# Patient Record
Sex: Male | Born: 1960 | Race: White | Hispanic: No | Marital: Single | Smoking: Former smoker
Health system: Southern US, Community
[De-identification: ages and names within clinical notes are randomized; demographics above are authoritative.]

## PROBLEM LIST (undated history)

## (undated) DIAGNOSIS — I10 Essential (primary) hypertension: Secondary | ICD-10-CM

## (undated) DIAGNOSIS — H547 Unspecified visual loss: Secondary | ICD-10-CM

## (undated) DIAGNOSIS — E785 Hyperlipidemia, unspecified: Secondary | ICD-10-CM

## (undated) HISTORY — PX: KNEE ARTHROSCOPY: SUR90

---

## 2006-05-18 HISTORY — PX: ANEURYSM COILING: SHX5349

## 2021-07-17 ENCOUNTER — Emergency Department (HOSPITAL_COMMUNITY): Payer: Managed Care, Other (non HMO)

## 2021-07-17 ENCOUNTER — Inpatient Hospital Stay (HOSPITAL_COMMUNITY)
Admission: EM | Admit: 2021-07-17 | Discharge: 2021-07-23 | DRG: 286 | Disposition: A | Discharge status: Home / Self Care | Payer: Managed Care, Other (non HMO) | Attending: Internal Medicine | Admitting: Internal Medicine

## 2021-07-17 ENCOUNTER — Encounter (HOSPITAL_COMMUNITY): Payer: Self-pay | Admitting: Emergency Medicine

## 2021-07-17 ENCOUNTER — Observation Stay (HOSPITAL_BASED_OUTPATIENT_CLINIC_OR_DEPARTMENT_OTHER): Payer: Managed Care, Other (non HMO)

## 2021-07-17 DIAGNOSIS — I2510 Atherosclerotic heart disease of native coronary artery without angina pectoris: Secondary | ICD-10-CM | POA: Diagnosis present

## 2021-07-17 DIAGNOSIS — H547 Unspecified visual loss: Secondary | ICD-10-CM | POA: Diagnosis present

## 2021-07-17 DIAGNOSIS — I428 Other cardiomyopathies: Secondary | ICD-10-CM | POA: Diagnosis present

## 2021-07-17 DIAGNOSIS — F1020 Alcohol dependence, uncomplicated: Secondary | ICD-10-CM | POA: Diagnosis present

## 2021-07-17 DIAGNOSIS — Z66 Do not resuscitate: Secondary | ICD-10-CM | POA: Diagnosis present

## 2021-07-17 DIAGNOSIS — I10 Essential (primary) hypertension: Secondary | ICD-10-CM | POA: Diagnosis present

## 2021-07-17 DIAGNOSIS — I429 Cardiomyopathy, unspecified: Secondary | ICD-10-CM | POA: Diagnosis not present

## 2021-07-17 DIAGNOSIS — I272 Pulmonary hypertension, unspecified: Secondary | ICD-10-CM | POA: Diagnosis present

## 2021-07-17 DIAGNOSIS — K761 Chronic passive congestion of liver: Secondary | ICD-10-CM | POA: Diagnosis present

## 2021-07-17 DIAGNOSIS — Z8249 Family history of ischemic heart disease and other diseases of the circulatory system: Secondary | ICD-10-CM | POA: Diagnosis not present

## 2021-07-17 DIAGNOSIS — Z6841 Body Mass Index (BMI) 40.0 and over, adult: Secondary | ICD-10-CM

## 2021-07-17 DIAGNOSIS — E785 Hyperlipidemia, unspecified: Secondary | ICD-10-CM | POA: Diagnosis present

## 2021-07-17 DIAGNOSIS — R570 Cardiogenic shock: Secondary | ICD-10-CM | POA: Diagnosis not present

## 2021-07-17 DIAGNOSIS — Z20822 Contact with and (suspected) exposure to covid-19: Secondary | ICD-10-CM | POA: Diagnosis present

## 2021-07-17 DIAGNOSIS — N179 Acute kidney failure, unspecified: Secondary | ICD-10-CM | POA: Diagnosis not present

## 2021-07-17 DIAGNOSIS — R7989 Other specified abnormal findings of blood chemistry: Secondary | ICD-10-CM | POA: Diagnosis present

## 2021-07-17 DIAGNOSIS — E871 Hypo-osmolality and hyponatremia: Secondary | ICD-10-CM | POA: Diagnosis present

## 2021-07-17 DIAGNOSIS — R000 Tachycardia, unspecified: Secondary | ICD-10-CM

## 2021-07-17 DIAGNOSIS — I471 Supraventricular tachycardia: Secondary | ICD-10-CM | POA: Diagnosis present

## 2021-07-17 DIAGNOSIS — F1010 Alcohol abuse, uncomplicated: Secondary | ICD-10-CM | POA: Diagnosis present

## 2021-07-17 DIAGNOSIS — E669 Obesity, unspecified: Secondary | ICD-10-CM | POA: Diagnosis present

## 2021-07-17 DIAGNOSIS — I4891 Unspecified atrial fibrillation: Secondary | ICD-10-CM | POA: Diagnosis present

## 2021-07-17 DIAGNOSIS — N170 Acute kidney failure with tubular necrosis: Secondary | ICD-10-CM | POA: Diagnosis present

## 2021-07-17 DIAGNOSIS — I483 Typical atrial flutter: Secondary | ICD-10-CM | POA: Diagnosis present

## 2021-07-17 DIAGNOSIS — I509 Heart failure, unspecified: Secondary | ICD-10-CM

## 2021-07-17 DIAGNOSIS — Z820 Family history of epilepsy and other diseases of the nervous system: Secondary | ICD-10-CM | POA: Diagnosis not present

## 2021-07-17 DIAGNOSIS — I5082 Biventricular heart failure: Secondary | ICD-10-CM | POA: Diagnosis present

## 2021-07-17 DIAGNOSIS — E875 Hyperkalemia: Secondary | ICD-10-CM | POA: Diagnosis present

## 2021-07-17 DIAGNOSIS — R062 Wheezing: Secondary | ICD-10-CM | POA: Insufficient documentation

## 2021-07-17 DIAGNOSIS — I110 Hypertensive heart disease with heart failure: Secondary | ICD-10-CM | POA: Diagnosis present

## 2021-07-17 DIAGNOSIS — R9431 Abnormal electrocardiogram [ECG] [EKG]: Secondary | ICD-10-CM | POA: Insufficient documentation

## 2021-07-17 DIAGNOSIS — I5021 Acute systolic (congestive) heart failure: Secondary | ICD-10-CM | POA: Diagnosis present

## 2021-07-17 DIAGNOSIS — Z87891 Personal history of nicotine dependence: Secondary | ICD-10-CM

## 2021-07-17 DIAGNOSIS — I4892 Unspecified atrial flutter: Secondary | ICD-10-CM | POA: Diagnosis present

## 2021-07-17 DIAGNOSIS — Z79899 Other long term (current) drug therapy: Secondary | ICD-10-CM

## 2021-07-17 HISTORY — DX: Hyperlipidemia, unspecified: E78.5

## 2021-07-17 HISTORY — DX: Essential (primary) hypertension: I10

## 2021-07-17 HISTORY — DX: Unspecified visual loss: H54.7

## 2021-07-17 LAB — CBC WITH DIFFERENTIAL/PLATELET
Abs Immature Granulocytes: 0.05 10*3/uL (ref 0.00–0.07)
Basophils Absolute: 0 10*3/uL (ref 0.0–0.1)
Basophils Relative: 1 %
Eosinophils Absolute: 0.1 10*3/uL (ref 0.0–0.5)
Eosinophils Relative: 2 %
HCT: 48.9 % (ref 39.0–52.0)
Hemoglobin: 15.8 g/dL (ref 13.0–17.0)
Immature Granulocytes: 1 %
Lymphocytes Relative: 21 %
Lymphs Abs: 1.8 10*3/uL (ref 0.7–4.0)
MCH: 30.4 pg (ref 26.0–34.0)
MCHC: 32.3 g/dL (ref 30.0–36.0)
MCV: 94 fL (ref 80.0–100.0)
Monocytes Absolute: 0.8 10*3/uL (ref 0.1–1.0)
Monocytes Relative: 9 %
Neutro Abs: 5.9 10*3/uL (ref 1.7–7.7)
Neutrophils Relative %: 66 %
Platelets: 335 10*3/uL (ref 150–400)
RBC: 5.2 MIL/uL (ref 4.22–5.81)
RDW: 14 % (ref 11.5–15.5)
WBC: 8.6 10*3/uL (ref 4.0–10.5)
nRBC: 0 % (ref 0.0–0.2)

## 2021-07-17 LAB — HEMOGLOBIN A1C
Hgb A1c MFr Bld: 5.6 % (ref 4.8–5.6)
Mean Plasma Glucose: 114.02 mg/dL

## 2021-07-17 LAB — PROTIME-INR
INR: 1.1 (ref 0.8–1.2)
Prothrombin Time: 14.6 seconds (ref 11.4–15.2)

## 2021-07-17 LAB — COMPREHENSIVE METABOLIC PANEL
ALT: 188 U/L — ABNORMAL HIGH (ref 0–44)
AST: 71 U/L — ABNORMAL HIGH (ref 15–41)
Albumin: 3.5 g/dL (ref 3.5–5.0)
Alkaline Phosphatase: 47 U/L (ref 38–126)
Anion gap: 10 (ref 5–15)
BUN: 28 mg/dL — ABNORMAL HIGH (ref 8–23)
CO2: 25 mmol/L (ref 22–32)
Calcium: 8.7 mg/dL — ABNORMAL LOW (ref 8.9–10.3)
Chloride: 98 mmol/L (ref 98–111)
Creatinine, Ser: 0.98 mg/dL (ref 0.61–1.24)
GFR, Estimated: >60 mL/min (ref >60)
Glucose, Bld: 118 mg/dL — ABNORMAL HIGH (ref 70–99)
Potassium: 4 mmol/L (ref 3.5–5.1)
Sodium: 133 mmol/L — ABNORMAL LOW (ref 135–145)
Total Bilirubin: 0.7 mg/dL (ref 0.3–1.2)
Total Protein: 6 g/dL — ABNORMAL LOW (ref 6.5–8.1)

## 2021-07-17 LAB — GLUCOSE, CAPILLARY: Glucose-Capillary: 173 mg/dL — ABNORMAL HIGH (ref 70–99)

## 2021-07-17 LAB — LIPID PANEL
Cholesterol: 152 mg/dL (ref 0–200)
HDL: 18 mg/dL — ABNORMAL LOW (ref >40)
LDL Cholesterol: 115 mg/dL — ABNORMAL HIGH (ref 0–99)
Total CHOL/HDL Ratio: 8.4 RATIO
Triglycerides: 96 mg/dL (ref <150)
VLDL: 19 mg/dL (ref 0–40)

## 2021-07-17 LAB — ECHOCARDIOGRAM COMPLETE
AR max vel: 2.71 cm2
AV Peak grad: 3.4 mmHg
Ao pk vel: 0.93 m/s
Area-P 1/2: 5.93 cm2
Calc EF: 24.2 %
MV M vel: 3.78 m/s
MV Peak grad: 57 mmHg
S' Lateral: 5.5 cm
Single Plane A2C EF: 25.9 %
Single Plane A4C EF: 23.8 %

## 2021-07-17 LAB — HIV ANTIBODY (ROUTINE TESTING W REFLEX): HIV Screen 4th Generation wRfx: NONREACTIVE

## 2021-07-17 LAB — TROPONIN I (HIGH SENSITIVITY): Troponin I (High Sensitivity): 74 ng/L — ABNORMAL HIGH (ref <18)

## 2021-07-17 LAB — TSH: TSH: 1.535 u[IU]/mL (ref 0.350–4.500)

## 2021-07-17 LAB — RESP PANEL BY RT-PCR (FLU A&B, COVID) ARPGX2
Influenza A by PCR: NEGATIVE
Influenza B by PCR: NEGATIVE
SARS Coronavirus 2 by RT PCR: NEGATIVE

## 2021-07-17 MED ORDER — FOLIC ACID 1 MG PO TABS
1.0000 mg | ORAL_TABLET | Freq: Every day | ORAL | Status: DC
  Administered 2021-07-17 – 2021-07-23 (×7): 1 mg via ORAL
  Filled 2021-07-17 (×7): qty 1

## 2021-07-17 MED ORDER — LORAZEPAM 2 MG/ML IJ SOLN: 1.0000 mg | INTRAMUSCULAR | Status: AC | PRN

## 2021-07-17 MED ORDER — ALBUTEROL SULFATE (2.5 MG/3ML) 0.083% IN NEBU: 2.5000 mg | INHALATION_SOLUTION | RESPIRATORY_TRACT | Status: DC | PRN

## 2021-07-17 MED ORDER — METOPROLOL TARTRATE 5 MG/5ML IV SOLN
5.0000 mg | Freq: Once | INTRAVENOUS | Status: AC
  Administered 2021-07-17: 5 mg via INTRAVENOUS
  Filled 2021-07-17: qty 5

## 2021-07-17 MED ORDER — ADULT MULTIVITAMIN W/MINERALS CH
1.0000 | ORAL_TABLET | Freq: Every day | ORAL | Status: DC
  Administered 2021-07-17 – 2021-07-23 (×7): 1 via ORAL
  Filled 2021-07-17 (×7): qty 1

## 2021-07-17 MED ORDER — PERFLUTREN LIPID MICROSPHERE
1.0000 mL | INTRAVENOUS | Status: AC | PRN
  Administered 2021-07-17: 2 mL via INTRAVENOUS
  Filled 2021-07-17: qty 10

## 2021-07-17 MED ORDER — DILTIAZEM HCL-DEXTROSE 125-5 MG/125ML-% IV SOLN (PREMIX)
5.0000 mg/h | INTRAVENOUS | Status: DC
  Administered 2021-07-17: 5 mg/h via INTRAVENOUS
  Filled 2021-07-17: qty 125

## 2021-07-17 MED ORDER — SODIUM CHLORIDE 0.9 % IV BOLUS
500.0000 mL | Freq: Once | INTRAVENOUS | Status: AC
  Administered 2021-07-17: 500 mL via INTRAVENOUS

## 2021-07-17 MED ORDER — HEPARIN (PORCINE) 25000 UT/250ML-% IV SOLN
2050.0000 [IU]/h | INTRAVENOUS | Status: DC
  Administered 2021-07-18: 12:00:00 1400 [IU]/h via INTRAVENOUS
  Administered 2021-07-19: 1600 [IU]/h via INTRAVENOUS
  Administered 2021-07-19 – 2021-07-21 (×4): 2050 [IU]/h via INTRAVENOUS
  Filled 2021-07-17 (×6): qty 250

## 2021-07-17 MED ORDER — RIVAROXABAN 20 MG PO TABS
20.0000 mg | ORAL_TABLET | Freq: Every day | ORAL | Status: DC
  Administered 2021-07-17: 20 mg via ORAL
  Filled 2021-07-17: qty 1

## 2021-07-17 MED ORDER — ONDANSETRON HCL 4 MG/2ML IJ SOLN: 4.0000 mg | Freq: Four times a day (QID) | INTRAMUSCULAR | Status: DC | PRN

## 2021-07-17 MED ORDER — DILTIAZEM LOAD VIA INFUSION
15.0000 mg | Freq: Once | INTRAVENOUS | Status: AC
  Administered 2021-07-17: 15 mg via INTRAVENOUS

## 2021-07-17 MED ORDER — ACETAMINOPHEN 325 MG PO TABS: 650.0000 mg | ORAL_TABLET | Freq: Four times a day (QID) | ORAL | Status: DC | PRN

## 2021-07-17 MED ORDER — LORAZEPAM 1 MG PO TABS: 1.0000 mg | ORAL_TABLET | ORAL | Status: AC | PRN

## 2021-07-17 MED ORDER — HYDRALAZINE HCL 20 MG/ML IJ SOLN: 5.0000 mg | INTRAMUSCULAR | Status: DC | PRN

## 2021-07-17 MED ORDER — SODIUM CHLORIDE 0.9% FLUSH
3.0000 mL | Freq: Two times a day (BID) | INTRAVENOUS | Status: DC
  Administered 2021-07-17 – 2021-07-22 (×6): 3 mL via INTRAVENOUS

## 2021-07-17 MED ORDER — METOPROLOL TARTRATE 50 MG PO TABS
50.0000 mg | ORAL_TABLET | Freq: Two times a day (BID) | ORAL | Status: DC
Start: 2021-07-17 — End: 2021-07-17
  Administered 2021-07-17: 50 mg via ORAL
  Filled 2021-07-17: qty 1

## 2021-07-17 MED ORDER — THIAMINE HCL 100 MG PO TABS
100.0000 mg | ORAL_TABLET | Freq: Every day | ORAL | Status: DC
  Administered 2021-07-17 – 2021-07-23 (×7): 100 mg via ORAL
  Filled 2021-07-17 (×7): qty 1

## 2021-07-17 MED ORDER — THIAMINE HCL 100 MG/ML IJ SOLN
100.0000 mg | Freq: Every day | INTRAMUSCULAR | Status: DC
  Filled 2021-07-17: qty 2

## 2021-07-17 MED ORDER — METOPROLOL TARTRATE 25 MG PO TABS
25.0000 mg | ORAL_TABLET | Freq: Two times a day (BID) | ORAL | Status: DC
  Filled 2021-07-17: qty 1

## 2021-07-17 MED ORDER — ONDANSETRON HCL 4 MG PO TABS: 4.0000 mg | ORAL_TABLET | Freq: Four times a day (QID) | ORAL | Status: DC | PRN

## 2021-07-17 MED ORDER — ACETAMINOPHEN 650 MG RE SUPP: 650.0000 mg | Freq: Four times a day (QID) | RECTAL | Status: DC | PRN

## 2021-07-17 MED ORDER — SIMVASTATIN 20 MG PO TABS
40.0000 mg | ORAL_TABLET | Freq: Every day | ORAL | Status: DC
  Administered 2021-07-17: 40 mg via ORAL
  Filled 2021-07-17 (×2): qty 2

## 2021-07-17 NOTE — Progress Notes (Signed)
ANTICOAGULATION CONSULT NOTE - Initial Consult ? ?Pharmacy Consult for heparin ?Indication: atrial fibrillation ? ?No Known Allergies ? ?Patient Measurements: ?Height: 5\' 10"  (177.8 cm) ?Weight: 122.5 kg (270 lb) ?IBW/kg (Calculated) : 73 ?Heparin Dosing Weight: 100kg ? ?Vital Signs: ?Temp: 98.7 ?F (37.1 ?C) (03/02 [id number]) ?Temp Source: Oral (03/02 [id number]) ?BP: 95/69 (03/02 1900) ?Pulse Rate: 86 (03/02 1900) ? ?Labs: ?Recent Labs  ?  07/17/21 ?1028 07/17/21 ?1815  ?HGB 15.8  --   ?HCT 48.9  --   ?PLT 335  --   ?LABPROT 14.6  --   ?INR 1.1  --   ?CREATININE 0.98  --   ?TROPONINIHS  --  74*  ? ? ?Estimated Creatinine Clearance: 103.9 mL/min (by C-G formula based on SCr of 0.98 mg/dL). ? ? ?Medical History: ?Past Medical History:  ?Diagnosis Date  ? Dyslipidemia   ? Hypertension   ? Visual impairment   ? L is severely impaired, R is about 20/30  ? ? ?Assessment: ?76 YOM presenting with hypertension, new onset afib w/RVR on diltiazem gtt, he is not on anticoagulation PTA however was started on Xarelto in ED last dose 3/2 @1238 , now plans for LHC ? ?Goal of Therapy:  ?Heparin level 0.3-0.7 units/ml ?aPTT 66-102 seconds ?Monitor platelets by anticoagulation protocol: Yes ?  ?Plan:  ?Heparin gtt at 1400 units/hr tomorrow at noon given last Xarelto dose, no bolus ?F/u cath plans and ability to transition back to PO ? ?Bertis Ruddy, PharmD ?Clinical Pharmacist ?ED Pharmacist Phone # [phone] ?07/17/2021 7:25 PM ? ? ? ? ?

## 2021-07-17 NOTE — Assessment & Plan Note (Addendum)
Patient was admitted to the cardiac unit and placed on a continuous telemetry monitoring.  ?Initially placed on metoprolol but had hypotension. ? ?He was placed on amiodarone with improvement in heart rate and hemodynamics.  ?He converted to sinus rhythm.  ? ?Patient will continue taking amiodarone for rate and rhythm control and anticoagulation with apixaban.  ?Outpatient evaluation for possible ablation.  ?Follow QTc as outpatient, it was prolonged on admission.  ?

## 2021-07-17 NOTE — Assessment & Plan Note (Addendum)
Code status addressed with patient.  ?

## 2021-07-17 NOTE — Assessment & Plan Note (Addendum)
Patient was placed on IV furosemide for diuresis, negative fluid balance was achieved, -19,444 ml, with significant improvement of his symptoms.  ? ?Further work up with echocardiogram showed, LV EF 20 to 25% with global hypokinesis, severe cavity dilatation, RV systolic function moderately reduced, RSVP 38 mmHg, moderate to sever mitral regurgitation.  ? 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  ?Mild non obstructive coronary artery disease.  ? ?Patient was placed on guideline directed therapy with entresto, empagliflozin, spironolactone with good toleration. ?Continue with digoxin and amiodarone.  ?Holding on B blockade for now.  ?Plan to follow up as outpatient.  ? ? ?

## 2021-07-17 NOTE — ED Notes (Signed)
EDP into room, at [facility]. HR 133. ?

## 2021-07-17 NOTE — ED Notes (Signed)
Admitting at BS

## 2021-07-17 NOTE — ED Notes (Signed)
Not in room, pt in triage ?

## 2021-07-17 NOTE — Assessment & Plan Note (Signed)
-  Significant tobacco history ?-Will order Albuterol HFA ?-Likely needs outpatient COPD evaluation ?

## 2021-07-17 NOTE — Assessment & Plan Note (Addendum)
Elevated liver enzymes. ?Patient with no alcohol withdrawal symptoms during his hospitalization. ?Advices about avoidance of alcohol. ?Follow up LFT as outpatient.  ? ?At his discharge AST 33 and ALT 74 ?

## 2021-07-17 NOTE — ED Triage Notes (Signed)
Pt. Stated, Christopher Romero had a fast heart beat off and on all week with some SOB  ?

## 2021-07-17 NOTE — ED Notes (Signed)
DIET ordered ?

## 2021-07-17 NOTE — Assessment & Plan Note (Signed)
-  Will attempt to avoid QT-prolonging medications such as PPI, nausea meds, SSRIs °-Repeat EKG in AM °

## 2021-07-17 NOTE — ED Notes (Signed)
Xray at BS 

## 2021-07-17 NOTE — ED Provider Notes (Signed)
[facility] EMERGENCY DEPARTMENT Provider Note   CSN: [id number] Arrival date & time: 07/17/21  [id number]     History  Chief Complaint  Patient presents with   Palpitations   Tachycardia   Shortness of Breath    Christopher Romero is a 61 y.o. male.  HPI Patient here for ongoing and worsening tachycardia, first discovered when he went to see his PCP, 4 days ago.  At that time he was given a prescription for metoprolol advised to come to the emergency department.  He decided to come today after staying with his sister last night.  He denies chest pain, weakness or dizziness.  He has had some shortness of breath with exertion.  He states his PCP did some blood work that showed his liver function was abnormal.  He denies previous cardiac or liver problems.  He has not had any fever or vomiting.    Home Medications Prior to Admission medications   Medication Sig Start Date End Date Taking? Authorizing Provider  acetaminophen (TYLENOL) 500 MG tablet Take 1,000 mg by mouth every 6 (six) hours as needed for mild pain.   Yes [provider]  Ibuprofen-diphenhydrAMINE Cit (IBUPROFEN PM PO) Take 1-2 tablets by mouth at bedtime as needed (sleep and pain).   Yes [provider]  lisinopril-hydrochlorothiazide (ZESTORETIC) 20-12.5 MG tablet Take 1 tablet by mouth daily. 07/14/21  Yes [provider]  simvastatin (ZOCOR) 40 MG tablet Take 40 mg by mouth daily. 07/14/21  Yes [provider]  metoprolol succinate (TOPROL-XL) 25 MG 24 hr tablet Take 25 mg by mouth daily. Patient not taking: Reported on 07/17/2021 07/14/21   [provider]      Allergies    Patient has no known allergies.    Review of Systems   Review of Systems  Physical Exam Updated Vital Signs BP 102/90    Pulse 88    Temp 98.7 F (37.1 C) (Oral)    Resp 16    SpO2 95%  Physical Exam Vitals and nursing note reviewed.  Constitutional:      General: He is not in acute  distress.    Appearance: He is well-developed. He is obese. He is not ill-appearing, toxic-appearing or diaphoretic.  HENT:     Head: Normocephalic and atraumatic.     Right Ear: External ear normal.     Left Ear: External ear normal.  Eyes:     Conjunctiva/sclera: Conjunctivae normal.     Pupils: Pupils are equal, round, and reactive to light.  Neck:     Trachea: Phonation normal.  Cardiovascular:     Rate and Rhythm: Regular rhythm. Tachycardia present.     Heart sounds: Normal heart sounds.  Pulmonary:     Effort: Pulmonary effort is normal. No respiratory distress.     Breath sounds: Normal breath sounds. No stridor. No wheezing or rhonchi.  Abdominal:     General: There is no distension.     Palpations: Abdomen is soft.     Tenderness: There is no abdominal tenderness.  Musculoskeletal:        General: No swelling or tenderness. Normal range of motion.     Cervical back: Normal range of motion and neck supple.     Right lower leg: No edema.     Left lower leg: No edema.  Skin:    General: Skin is warm and dry.  Neurological:     Mental Status: He is alert and oriented to person, place,  and time.     Cranial Nerves: No cranial nerve deficit.     Sensory: No sensory deficit.     Motor: No abnormal muscle tone.     Coordination: Coordination normal.  Psychiatric:        Mood and Affect: Mood normal.        Behavior: Behavior normal.        Thought Content: Thought content normal.        Judgment: Judgment normal.    ED Results / Procedures / Treatments   Labs (all labs ordered are listed, but only abnormal results are displayed) Labs Reviewed  COMPREHENSIVE METABOLIC PANEL - Abnormal; Notable for the following components:      Result Value   Sodium 133 (*)    Glucose, Bld 118 (*)    BUN 28 (*)    Calcium 8.7 (*)    Total Protein 6.0 (*)    AST 71 (*)    ALT 188 (*)    All other components within normal limits  RESP PANEL BY RT-PCR (FLU A&B, COVID) [id number]  CBC  WITH DIFFERENTIAL/PLATELET  PROTIME-INR    EKG EKG Interpretation  Date/Time:  Thursday July 17 2021 09:56:09 EST Ventricular Rate:  151 PR Interval:    QRS Duration: 94 QT Interval:  320 QTC Calculation: 507 R Axis:   64 Text Interpretation: Atrial flutter with 2:1 A-V conduction Nonspecific ST abnormality Abnormal ECG No previous ECGs available Confirmed by Daleen Bo [phone]) on 07/17/2021 10:51:09 AM   EKG Interpretation  Date/Time:  Thursday July 17 2021 10:40:53 EST Ventricular Rate:  150 PR Interval:    QRS Duration: 89 QT Interval:  321 QTC Calculation: 508 R Axis:   30 Text Interpretation: Atrial flutter with predominant 2:1 AV block Repolarization abnormality, prob rate related Prolonged QT interval Since last tracing of earlier today No significant change was found Confirmed by Daleen Bo [phone]) on 07/17/2021 10:52:34 AM         EKG Interpretation  Date/Time:  Thursday July 17 2021 12:56:24 EST Ventricular Rate:  87 PR Interval:    QRS Duration: 122 QT Interval:  425 QTC Calculation: 512 R Axis:   13 Text Interpretation: Atrial flutter Nonspecific intraventricular conduction delay Borderline T abnormalities, lateral leads Since last tracing rate slower Otherwise no significant change Confirmed by Daleen Bo [phone]) on 07/17/2021 12:59:15 PM         Radiology DG Chest Port 1 View  Result Date: 07/17/2021 CLINICAL DATA:  61 year old male with tachycardia, palpitations, some shortness of breath. EXAM: PORTABLE CHEST 1 VIEW COMPARISON:  None. FINDINGS: Portable AP upright view at 1044 hours. Mild cardiomegaly. Other mediastinal contours are within normal limits. Visualized tracheal air column is within normal limits. No definite pleural effusion. Allowing for portable technique the lungs are clear. No pneumothorax. No acute osseous abnormality identified. IMPRESSION: Borderline to mild cardiomegaly. No acute cardiopulmonary abnormality. Electronically  Signed   By: Genevie Ann M.D.   On: 07/17/2021 10:54    Procedures Procedures    Medications Ordered in ED Medications  diltiazem (CARDIZEM) 1 mg/mL load via infusion 15 mg (15 mg Intravenous Bolus from Bag 07/17/21 1210)    And  diltiazem (CARDIZEM) 125 mg in dextrose 5% 125 mL (1 mg/mL) infusion (5 mg/hr Intravenous New Bag/Given 07/17/21 1211)  rivaroxaban (XARELTO) tablet 20 mg (20 mg Oral Given 07/17/21 1238)  sodium chloride 0.9 % bolus 500 mL (0 mLs Intravenous Stopped 07/17/21 1108)  metoprolol tartrate (LOPRESSOR) injection 5 mg (  5 mg Intravenous Given 07/17/21 1040)    ED Course/ Medical Decision Making/ A&P Clinical Course as of 07/17/21 1258  Thu Jul 17, 2021  1146 Transient improvement in heart rate to low 100s, after Lopressor, now heart rate 130-140 and irregular indicating atrial fibrillation.  Will order Cardizem load and maintenance dose. [EW]  [id number] Heart rate improved to the 90s.  Appears to be atrial flutter on the cardiac monitor. [EW]    Clinical Course User Index [EW] Daleen Bo, MD                           Medical Decision Making Patient presenting for evaluation of tachycardia.  Initial EKG is consistent with atrial flutter, with 2-1 AV conduction.  He is not currently anticoagulated.  This is a new problem for this patient.  Problems Addressed: Tachycardia: acute illness or injury    Details: Associated with atrial flutter, 2-1 block Typical atrial flutter ([place]): acute illness or injury    Details: New onset  Amount and/or Complexity of Data Reviewed Independent Historian:     Details: He is a cogent historian Labs: ordered.    Details: CBC, metabolic panel, PT/INR, viral panel-normal except sodium low, glucose high, BUN high, calcium low, total protein low, AST high, ALT high Radiology: ordered and independent interpretation performed.    Details: Chest x-ray-no infiltrate or edema. ECG/medicine tests: ordered and independent interpretation performed.     Details: Cardiac monitor - atrial flutter with rapid rate, atrial fibrillation with rapid rate, atrial flutter with normal rate Discussion of management or test interpretation with external provider(s): Discussion with hospitalist to arrange admission.  Dr. Lorin Mercy responded and will see the patient, 1:20 PM.  Risk Prescription drug management. Decision regarding hospitalization. Risk Details: Patient with new onset atrial flutter, rapid rate, requiring medication treatment initially with Lopressor, which only transiently improved heart rate.  Cardizem drip improved heart rate to less than 100.  Xarelto started to treat for prevention of blood clot.  Patient requires hospitalization for cardiac echo and stabilization.  He will likely need cardiology consultation.  Critical Care Total time providing critical care: 30-74 minutes          Final Clinical Impression(s) / ED Diagnoses Final diagnoses:  Typical atrial flutter ([place])  Tachycardia    Rx / DC Orders ED Discharge Orders     None         Daleen Bo, MD 07/17/21 1321

## 2021-07-17 NOTE — Consult Note (Addendum)
Cardiology Consultation:   Patient ID: Christopher Romero MRN: [id number]; DOB: 06-Nov-1960  Admit date: 07/17/2021 Date of Consult: 07/17/2021  PCP:  Christopher Romero, No   [facility] Providers Cardiologist:  None new  Patient Profile:   Christopher Romero is a 61 y.o. male with a hx of HTN, HLD, obesity, who is being seen 07/17/2021 for the evaluation of rapid atrial fib at the request of Christopher Romero.  History of Present Illness:   Christopher Romero started feeling bad about 2 weeks ago, but no palpitations, SOB or chest pain. At that time, he started having his usual seasonal sinus problems. He started himself on his usual seasonal meds of Alka Seltzer Sinus and Mucinex. The allergy sx improved. But he still did not feel bad.   Last Friday, he had his BP taken and it was higher than usual, w/ SBP 140s. No abnormal HR mentioned.   This past Monday, he had his BP taken and it was again elevated, w/ SBP upper 140s. However, his HR was in the 150s, the first time that had been mentioned.   At times, he felt weak, and had pressure in his head. Unknown what his HR was at that time. Has been having periodic cold sweats.  He was having problems because his PCP had moved, her new office did not take his ins.   He went to a new Doctor, Christopher Aus, NP at the old office on Tues, they called him the next day, and added more labs. His liver enzymes were high. He got an ECG, but never heard about the results.   He was at work on [employer], and his BP/HR were again elevated.   His sister brought him to the ER this am. He feels generally horrible.  However, he feels better today than he has in 2 weeks.   He is completely unaware of his rapid or irregular HR.  He denies PND, has daytime LE edema but does not wake w/ it.    Past Medical History:  Diagnosis Date   Dyslipidemia    Hypertension    Visual impairment    L is severely impaired, R is about 20/30    Past Surgical History:  Procedure Laterality Date   ANEURYSM  COILING  2008   visual impairment, sees little out of the left   KNEE ARTHROSCOPY       Home Medications:  Prior to Admission medications   Medication Sig Start Date End Date Taking? Authorizing Provider  acetaminophen (TYLENOL) 500 MG tablet Take 1,000 mg by mouth every 6 (six) hours as needed for mild pain.   Yes [provider]  Ibuprofen-diphenhydrAMINE Cit (IBUPROFEN PM PO) Take 1-2 tablets by mouth at bedtime as needed (sleep and pain).   Yes [provider]  lisinopril-hydrochlorothiazide (ZESTORETIC) 20-12.5 MG tablet Take 1 tablet by mouth daily. 07/14/21  Yes [provider]  simvastatin (ZOCOR) 40 MG tablet Take 40 mg by mouth daily. 07/14/21  Yes [provider]  metoprolol succinate (TOPROL-XL) 25 MG 24 hr tablet Take 25 mg by mouth daily. Patient not taking: Reported on 07/17/2021 07/14/21   [provider]    Inpatient Medications: Scheduled Meds:  folic acid  1 mg Oral Daily   multivitamin with minerals  1 tablet Oral Daily   rivaroxaban  20 mg Oral Q supper   simvastatin  40 mg Oral Daily   sodium chloride flush  3 mL Intravenous Q12H   thiamine  100 mg Oral Daily  Or   thiamine  100 mg Intravenous Daily   Continuous Infusions:  diltiazem (CARDIZEM) infusion 5 mg/hr (07/17/21 1211)   PRN Meds: acetaminophen **OR** acetaminophen, albuterol, hydrALAZINE, LORazepam **OR** LORazepam, ondansetron **OR** ondansetron (ZOFRAN) IV  Allergies:   No Known Allergies  Social History:   Social History   Socioeconomic History   Marital status: Single    Spouse name: Not on file   Number of children: Not on file   Years of education: Not on file   Highest education level: Not on file  Occupational History   Occupation: foam Associate Professor  Tobacco Use   Smoking status: Former    Packs/day: 2.00    Years: 40.00    Pack years: 80.00    Types: Cigarettes   Smokeless tobacco: Never  Substance and Sexual Activity    Alcohol use: Yes    Comment: 2-12 daily, denies having a problem with ETOH   Drug use: Never   Sexual activity: Not on file  Other Topics Concern   Not on file  Social History Narrative   Not on file   Social Determinants of Health   Financial Resource Strain: Not on file  Food Insecurity: Not on file  Transportation Needs: Not on file  Physical Activity: Not on file  Stress: Not on file  Social Connections: Not on file  Intimate Partner Violence: Not on file    Family History:   Family History  Problem Relation Age of Onset   Dementia Mother    Parkinsonism Mother    Cancer Father    CAD Father 63   Hypertension Sister    CAD Sister    Hypertension Brother    CAD Other    Heart failure Other      ROS:  Please see the history of present illness.  All other ROS reviewed and negative.     Physical Exam/Data:   Vitals:   07/17/21 1700 07/17/21 1715 07/17/21 1730 07/17/21 1745  BP: (!) 144/126  102/67 (!) 111/94  Pulse:      Resp: Temp:      TempSrc:      SpO2: 94% 95% 96% 93%    Intake/Output Summary (Last 24 hours) at 07/17/2021 1755 Last data filed at 07/17/2021 1108 Gross per 24 hour  Intake 500 ml  Output --  Net 500 ml   No flowsheet data found.   There is no height or weight on file to calculate BMI.  General:  Well nourished, well developed, in no acute distress HEENT: normal Neck: no JVD seen, difficult to assess 2nd body habitus Vascular: No carotid bruits; Distal pulses 2+ bilaterally Cardiac:  normal S1, S2; Irreg R&R; no murmur Lungs:  clear to auscultation bilaterally, no wheezing, rhonchi or rales  Abd: soft, nontender, no hepatomegaly  Ext: no edema Musculoskeletal:  No deformities, BUE and BLE strength normal and equal Skin: warm and dry  Neuro:  CNs 2-12 intact, no focal abnormalities noted Psych:  Normal affect   EKG:  The EKG was personally reviewed and demonstrates:  Atrial flutter vs coarse atrial fib, HR 90 Telemetry:   Telemetry was personally reviewed and demonstrates:  atrial fib, PVCs  Relevant CV Studies:  ECHO: 07/17/2021  1. No apical thrombus with Definity contrast. Left ventricular ejection  fraction, by estimation, is 20 to 25%. Left ventricular ejection fraction  by 2D MOD biplane is 24.2 %. The left ventricle has severely decreased  function. The left ventricle  demonstrates global hypokinesis. The left ventricular internal cavity size was severely dilated. Left ventricular diastolic function could not be evaluated.   2. Right ventricular systolic function is moderately reduced. The right  ventricular size is mildly enlarged. There is normal pulmonary artery  systolic pressure. The estimated right ventricular systolic pressure is  33.8 mmHg.   3. Left atrial size was mildly dilated.   4. The mitral valve is grossly normal. Moderate to severe mitral valve  regurgitation.   5. The aortic valve is tricuspid. Aortic valve regurgitation is not  visualized.   6. The inferior vena cava is dilated in size with <50% respiratory  variability, suggesting right atrial pressure of 15 mmHg.   Comparison(s): No prior Echocardiogram.   Laboratory Data:  High Sensitivity Troponin:  No results for input(s): TROPONINIHS in the last 720 hours.   Chemistry Recent Labs  Lab 07/17/21 1028  NA 133*  K 4.0  CL 98  CO2 25  GLUCOSE 118*  BUN 28*  CREATININE 0.98  CALCIUM 8.7*  GFRNONAA >60  ANIONGAP 10    Recent Labs  Lab 07/17/21 1028  PROT 6.0*  ALBUMIN 3.5  AST 71*  ALT 188*  ALKPHOS 47  BILITOT 0.7   Lipids No results for input(s): CHOL, TRIG, HDL, LABVLDL, LDLCALC, CHOLHDL in the last 168 hours.  Hematology Recent Labs  Lab 07/17/21 1028  WBC 8.6  RBC 5.20  HGB 15.8  HCT 48.9  MCV 94.0  MCH 30.4  MCHC 32.3  RDW 14.0  PLT 335   Thyroid No results for input(s): TSH, FREET4 in the last 168 hours.  BNPNo results for input(s): BNP, PROBNP in the last 168 hours.  DDimer No results  for input(s): DDIMER in the last 168 hours. No results found for: HGBA1C   Radiology/Studies:  DG Chest Port 1 View  Result Date: 07/17/2021 CLINICAL DATA:  61 year old male with tachycardia, palpitations, some shortness of breath. EXAM: PORTABLE CHEST 1 VIEW COMPARISON:  None. FINDINGS: Portable AP upright view at 1044 hours. Mild cardiomegaly. Other mediastinal contours are within normal limits. Visualized tracheal air column is within normal limits. No definite pleural effusion. Allowing for portable technique the lungs are clear. No pneumothorax. No acute osseous abnormality identified. IMPRESSION: Borderline to mild cardiomegaly. No acute cardiopulmonary abnormality. Electronically Signed   By: Odessa Fleming M.D.   On: 07/17/2021 10:54   ECHOCARDIOGRAM COMPLETE  Result Date: 07/17/2021    ECHOCARDIOGRAM REPORT   Patient Name:   Christopher Romero Date of Exam: 07/17/2021 Medical Rec #:  [id number]     Height: Accession #:    [id number]    Weight: Date of Birth:  12-Apr-1961    BSA: Patient Age:    60 years      BP:           103/80 mmHg Patient Gender: M             HR:           79 bpm. Exam Location:  Inpatient Procedure: 2D Echo, Color Doppler, Cardiac Doppler and Intracardiac            Opacification Agent Indications:    Afib  History:        Patient has no prior history of Echocardiogram examinations.                 Risk Factors:Hypertension and Dyslipidemia.  Sonographer:    Cleatis Polka Referring Phys: [id number] JENNIFER YATES IMPRESSIONS  1. No apical thrombus  with Definity contrast. Left ventricular ejection fraction, by estimation, is 20 to 25%. Left ventricular ejection fraction by 2D MOD biplane is 24.2 %. The left ventricle has severely decreased function. The left ventricle demonstrates global hypokinesis. The left ventricular internal cavity size was severely dilated. Left ventricular diastolic function could not be evaluated.  2. Right ventricular systolic function is moderately reduced. The right  ventricular size is mildly enlarged. There is normal pulmonary artery systolic pressure. The estimated right ventricular systolic pressure is 33.8 mmHg.  3. Left atrial size was mildly dilated.  4. The mitral valve is grossly normal. Moderate to severe mitral valve regurgitation.  5. The aortic valve is tricuspid. Aortic valve regurgitation is not visualized.  6. The inferior vena cava is dilated in size with <50% respiratory variability, suggesting right atrial pressure of 15 mmHg. Comparison(s): No prior Echocardiogram. FINDINGS  Left Ventricle: No apical thrombus with Definity contrast. Left ventricular ejection fraction, by estimation, is 20 to 25%. Left ventricular ejection fraction by 2D MOD biplane is 24.2 %. The left ventricle has severely decreased function. The left ventricle demonstrates global hypokinesis. The left ventricular internal cavity size was severely dilated. There is no left ventricular hypertrophy. Left ventricular diastolic function could not be evaluated due to atrial fibrillation. Left ventricular diastolic function could not be evaluated. Right Ventricle: The right ventricular size is mildly enlarged. No increase in right ventricular wall thickness. Right ventricular systolic function is moderately reduced. There is normal pulmonary artery systolic pressure. The tricuspid regurgitant velocity is 2.17 m/s, and with an assumed right atrial pressure of 15 mmHg, the estimated right ventricular systolic pressure is 33.8 mmHg. Left Atrium: Left atrial size was mildly dilated. Right Atrium: Right atrial size was normal in size. Pericardium: There is no evidence of pericardial effusion. Mitral Valve: The mitral valve is grossly normal. Moderate to severe mitral valve regurgitation, with posteriorly-directed jet. Tricuspid Valve: The tricuspid valve is grossly normal. Tricuspid valve regurgitation is mild. Aortic Valve: The aortic valve is tricuspid. Aortic valve regurgitation is not visualized.  Aortic valve peak gradient measures 3.4 mmHg. Pulmonic Valve: The pulmonic valve was normal in structure. Pulmonic valve regurgitation is not visualized. Aorta: The aortic root and ascending aorta are structurally normal, with no evidence of dilitation. Venous: The inferior vena cava is dilated in size with less than 50% respiratory variability, suggesting right atrial pressure of 15 mmHg. IAS/Shunts: No atrial level shunt detected by color flow Doppler.  LEFT VENTRICLE PLAX 2D                        Biplane EF (MOD) LVIDd:         6.70 cm         LV Biplane EF:   Left LVIDs:         5.50 cm                          ventricular LV PW:         1.10 cm                          ejection LV IVS:        1.00 cm                          fraction by LVOT diam:     2.20 cm  2D MOD LV SV:         42                               biplane is LVOT Area:     3.80 cm                         24.2 %.                                 Diastology LV Volumes (MOD)               LV e' medial:    6.85 cm/s LV vol d, MOD    216.0 ml      LV E/e' medial:  14.9 A2C:                           LV e' lateral:   8.92 cm/s LV vol d, MOD    210.0 ml      LV E/e' lateral: 11.4 A4C: LV vol s, MOD    160.0 ml A2C: LV vol s, MOD    160.0 ml A4C: LV SV MOD A2C:   56.0 ml LV SV MOD A4C:   210.0 ml LV SV MOD BP:    52.1 ml RIGHT VENTRICLE            IVC RV Basal diam:  4.20 cm    IVC diam: 2.90 cm RV Mid diam:    2.90 cm RV S prime:     5.66 cm/s TAPSE (M-mode): 1.4 cm LEFT ATRIUM             RIGHT ATRIUM LA diam:        4.80 cm RA Area:     24.20 cm LA Vol (A2C):   62.8 ml RA Volume:   75.30 ml LA Vol (A4C):   76.3 ml LA Biplane Vol: 69.4 ml  AORTIC VALVE AV Area (Vmax): 2.71 cm AV Vmax:        92.60 cm/s AV Peak Grad:   3.4 mmHg LVOT Vmax:      65.90 cm/s LVOT Vmean:     49.900 cm/s LVOT VTI:       0.110 m  AORTA Ao Root diam: 3.60 cm Ao Asc diam:  3.10 cm MITRAL VALVE                TRICUSPID VALVE MV Area (PHT): 5.93 cm      TR Peak grad:   18.8 mmHg MV Decel Time: 128 msec     TR Vmax:        217.00 cm/s MR Peak grad: 57.0 mmHg MR Mean grad: 42.0 mmHg     SHUNTS MR Vmax:      377.50 cm/s   Systemic VTI:  0.11 m MR Vmean:     314.0 cm/s    Systemic Diam: 2.20 cm MV E velocity: 102.00 cm/s Zoila Shutter MD Electronically signed by Zoila Shutter MD Signature Date/Time: 07/17/2021/3:16:56 PM    Final      Assessment and Plan:   Atrial fib, RVR - unknown duration, but > 48 hr - R atrium nl, L atrium mildly dilated - EF 20-25%, also new - he is responding to IV Cardizem w/ improved rate, however, this is not the  best med w/ low EF - discuss w/ MD changing to scheduled IV >> PO metoprolol - add anticoagulation  2. CM, unknown if NICM or ICM - could be rate-related - minimal LE edema, increased DOE, unable to assess JVD - CXR ok, no obvious volume overload. - add metoprolol and change to XL once dose finalized, had been prescribed Toprol XL 25 mg qd, but was not taking  - discuss [place] w/ MD, has not been studied in pts w/ liver dz but other SGLT2s cannot be used - BP has been low at times, will not add Entresto right now  3. Abnl LFTs - new finding by his PCP this week - unclear if ETOH is the cause - per IM   Risk Assessment/Risk Scores:        CHA2DS2-VASc Score = 2   This indicates a 2.2% annual risk of stroke. The patient's score is based upon: CHF History: 1 HTN History: 1 Diabetes History: 0 Stroke History: 0 Vascular Disease History: 0 Age Score: 0 Gender Score: 0   For questions or updates, please contact [facility] Please consult [email or web address] for contact info under    Signed, Theodore Demark, PA-C  07/17/2021 5:55 PM  Personally seen and examined. Agree with above.  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  Morbid obesity. On exam, 2+ lower extremity edema, heart rate irregularly irregular normal rate,  thick neck, minimal crackles at bases. -Start IV heparin-eventually transition to Eliquis. -Plan for left and right heart catheterization to understand etiology of his reduced ejection fraction, could be ischemic, could be alcohol related, could be tachycardia mediated.  N.p.o. past midnight.  Risk and benefits of been explained including stroke heart attack death renal impairment bleeding.  He is willing to proceed.  Would reverse the DNR for the procedure. -Elevated LFTs are likely hepatic congestion.  AST 71/ALT 188. -Encouraged alcohol cessation.  He does state that he usually drinks maybe 3 beers a night and more on the weekend. -Serum sodium 133, slightly low likely secondary to his underlying cardiomyopathy. -We will transition him off of the IV diltiazem 10 mg/h and place him on metoprolol tartrate 50 mg twice daily for continued rate control of his atrial fibrillation.  Watch blood pressures. -Hold off at this point on ARN I/Entresto because of relative hypotension.  CRITICAL CARE Performed by: Donato Schultz   Total critical care time: 35 minutes  Critical care time was exclusive of separately billable procedures and treating other patients.  Critical care was necessary to treat or prevent imminent or life-threatening deterioration.  Critical care was time spent personally by me on the following activities: development of treatment plan with patient and/or surrogate as well as nursing, discussions with consultants, evaluation of patient's response to treatment, examination of patient, obtaining history from patient or surrogate, ordering and performing treatments and interventions, ordering and review of laboratory studies, ordering and review of radiographic studies, pulse oximetry and re-evaluation of patient's condition.   Donato Schultz, MD

## 2021-07-17 NOTE — H&P (Signed)
History and Physical    Patient: Christopher Romero WUJ:[id number] DOB: [birth year] DOA: 07/17/2021 DOS: the patient was seen and examined on 07/17/2021 PCP: [facility], [place] Patient coming from: Home - lives with a friend and his girlfriend; NOK: Sister, [phone]   Chief Complaint: Palpitations  HPI: Christopher Romero is a 61 y.o. male with medical history significant of HTN and HLD presenting with palpitations. He had an episode where he ran out of meds.  He ran out of meds in mid-January. He  "Kept feeling funny."  His BP increased and his HR increased.  He went to his PCP on Monday and his LFTs were up.  He had a high heart rate and BP that day.  The PCP and told him to go to the ER on Wednesday for high liver enzymes.  He feels better than he has all week right now.  He just didn't feel right, it was a funny feeling.  He knew his HR was racing and slowing down this week.  No CP.  +SOB - he noticed that when it got warm outside and the pollen picked up.  +DOE, no orthopnea.  +cough that is new, nonproductive.    ER Course:  Saw his PCP Monday, HR 130.  Given metoprolol but "told not to take it."  Has cardiology appt tomorrow. HR 150 on arrival, in flutter.  Given Lopressor and Dilt drip.  Rate controlled aflutter.  Given Xarelto.     Review of Systems: As mentioned in the history of present illness. All other systems reviewed and are negative. Past Medical History:  Diagnosis Date   Dyslipidemia    Hypertension    Visual impairment    L is severely impaired, R is about 20/30   Past Surgical History:  Procedure Laterality Date   ANEURYSM COILING  2008   visual impairment, sees little out of the left   KNEE ARTHROSCOPY     Social History:  reports that he has quit smoking. His smoking use included cigarettes. He has a 80.00 pack-year smoking history. He has never used smokeless tobacco. He reports current alcohol use. He reports that he does not use drugs.  No Known  Allergies  Family History  Problem Relation Age of Onset   Dementia Mother    Parkinsonism Mother    Cancer Father    CAD Father 46   Hypertension Sister    CAD Sister    Hypertension Brother    CAD Other    Heart failure Other     Prior to Admission medications   Medication Sig Start Date End Date Taking? Authorizing Provider  acetaminophen (TYLENOL) 500 MG tablet Take 1,000 mg by mouth every 6 (six) hours as needed for mild pain.   Yes [provider]  Ibuprofen-diphenhydrAMINE Cit (IBUPROFEN PM PO) Take 1-2 tablets by mouth at bedtime as needed (sleep and pain).   Yes [provider]  lisinopril-hydrochlorothiazide (ZESTORETIC) 20-12.5 MG tablet Take 1 tablet by mouth daily. 07/14/21  Yes [provider]  simvastatin (ZOCOR) 40 MG tablet Take 40 mg by mouth daily. 07/14/21  Yes [provider]  metoprolol succinate (TOPROL-XL) 25 MG 24 hr tablet Take 25 mg by mouth daily. Patient not taking: Reported on 07/17/2021 07/14/21   [provider]    Physical Exam: Vitals:   07/17/21 1430 07/17/21 1445 07/17/21 1500 07/17/21 1515  BP: 101/82 95/69 (!) 129/109 93/77  Pulse: 86 86 88 76  Resp: 13 12 12 14   Temp:  TempSrc:      SpO2: 94% 95% 96% 96%   General:  Appears calm and comfortable and is in NAD Eyes:  PERRL, EOMI, normal lids, iris ENT:  grossly normal hearing, lips & tongue, mmm; appropriate dentition Neck:  no LAD, masses or thyromegaly Cardiovascular:  Irregularly irregular (minimally) with rate control on Dilt, no m/r/g. No LE edema.  Respiratory:   +expiratory wheezing.  Normal respiratory effort. Abdomen:  soft, NT, ND Skin:  no rash or induration seen on limited exam Musculoskeletal:  grossly normal tone BUE/BLE, good ROM, no bony abnormality Psychiatric:  grossly normal mood and affect, speech fluent and appropriate, AOx3 Neurologic:  CN 2-12 grossly intact, moves all extremities in coordinated  fashion   Radiological Exams on Admission: Independently reviewed - see discussion in A/P where applicable  DG Chest Port 1 View  Result Date: 07/17/2021 CLINICAL DATA:  61 year old male with tachycardia, palpitations, some shortness of breath. EXAM: PORTABLE CHEST 1 VIEW COMPARISON:  None. FINDINGS: Portable AP upright view at 1044 hours. Mild cardiomegaly. Other mediastinal contours are within normal limits. Visualized tracheal air column is within normal limits. No definite pleural effusion. Allowing for portable technique the lungs are clear. No pneumothorax. No acute osseous abnormality identified. IMPRESSION: Borderline to mild cardiomegaly. No acute cardiopulmonary abnormality. Electronically Signed   By: Odessa Fleming M.D.   On: 07/17/2021 10:54   ECHOCARDIOGRAM COMPLETE  Result Date: 07/17/2021    ECHOCARDIOGRAM REPORT   Patient Name:   Christopher Romero Date of Exam: 07/17/2021 Medical Rec #:  [id number]     Height: Accession #:    [id number]    Weight: Date of Birth:  10-Oct-1960    BSA: Patient Age:    60 years      BP:           103/80 mmHg Patient Gender: M             HR:           79 bpm. Exam Location:  Inpatient Procedure: 2D Echo, Color Doppler, Cardiac Doppler and Intracardiac            Opacification Agent Indications:    Afib  History:        Patient has no prior history of Echocardiogram examinations.                 Risk Factors:Hypertension and Dyslipidemia.  Sonographer:    Cleatis Polka Referring Phys: [id number] Sequan Auxier IMPRESSIONS  1. No apical thrombus with Definity contrast. Left ventricular ejection fraction, by estimation, is 20 to 25%. Left ventricular ejection fraction by 2D MOD biplane is 24.2 %. The left ventricle has severely decreased function. The left ventricle demonstrates global hypokinesis. The left ventricular internal cavity size was severely dilated. Left ventricular diastolic function could not be evaluated.  2. Right ventricular systolic function is moderately reduced.  The right ventricular size is mildly enlarged. There is normal pulmonary artery systolic pressure. The estimated right ventricular systolic pressure is 33.8 mmHg.  3. Left atrial size was mildly dilated.  4. The mitral valve is grossly normal. Moderate to severe mitral valve regurgitation.  5. The aortic valve is tricuspid. Aortic valve regurgitation is not visualized.  6. The inferior vena cava is dilated in size with <50% respiratory variability, suggesting right atrial pressure of 15 mmHg. Comparison(s): No prior Echocardiogram. FINDINGS  Left Ventricle: No apical thrombus with Definity contrast. Left ventricular ejection fraction, by estimation, is 20 to 25%. Left ventricular ejection fraction  by 2D MOD biplane is 24.2 %. The left ventricle has severely decreased function. The left ventricle demonstrates global hypokinesis. The left ventricular internal cavity size was severely dilated. There is no left ventricular hypertrophy. Left ventricular diastolic function could not be evaluated due to atrial fibrillation. Left ventricular diastolic function could not be evaluated. Right Ventricle: The right ventricular size is mildly enlarged. No increase in right ventricular wall thickness. Right ventricular systolic function is moderately reduced. There is normal pulmonary artery systolic pressure. The tricuspid regurgitant velocity is 2.17 m/s, and with an assumed right atrial pressure of 15 mmHg, the estimated right ventricular systolic pressure is 33.8 mmHg. Left Atrium: Left atrial size was mildly dilated. Right Atrium: Right atrial size was normal in size. Pericardium: There is no evidence of pericardial effusion. Mitral Valve: The mitral valve is grossly normal. Moderate to severe mitral valve regurgitation, with posteriorly-directed jet. Tricuspid Valve: The tricuspid valve is grossly normal. Tricuspid valve regurgitation is mild. Aortic Valve: The aortic valve is tricuspid. Aortic valve regurgitation is not  visualized. Aortic valve peak gradient measures 3.4 mmHg. Pulmonic Valve: The pulmonic valve was normal in structure. Pulmonic valve regurgitation is not visualized. Aorta: The aortic root and ascending aorta are structurally normal, with no evidence of dilitation. Venous: The inferior vena cava is dilated in size with less than 50% respiratory variability, suggesting right atrial pressure of 15 mmHg. IAS/Shunts: No atrial level shunt detected by color flow Doppler.  LEFT VENTRICLE PLAX 2D                        Biplane EF (MOD) LVIDd:         6.70 cm         LV Biplane EF:   Left LVIDs:         5.50 cm                          ventricular LV PW:         1.10 cm                          ejection LV IVS:        1.00 cm                          fraction by LVOT diam:     2.20 cm                          2D MOD LV SV:         42                               biplane is LVOT Area:     3.80 cm                         24.2 %.                                 Diastology LV Volumes (MOD)               LV e' medial:    6.85 cm/s LV vol d, MOD    216.0 ml  LV E/e' medial:  14.9 A2C:                           LV e' lateral:   8.92 cm/s LV vol d, MOD    210.0 ml      LV E/e' lateral: 11.4 A4C: LV vol s, MOD    160.0 ml A2C: LV vol s, MOD    160.0 ml A4C: LV SV MOD A2C:   56.0 ml LV SV MOD A4C:   210.0 ml LV SV MOD BP:    52.1 ml RIGHT VENTRICLE            IVC RV Basal diam:  4.20 cm    IVC diam: 2.90 cm RV Mid diam:    2.90 cm RV S prime:     5.66 cm/s TAPSE (M-mode): 1.4 cm LEFT ATRIUM             RIGHT ATRIUM LA diam:        4.80 cm RA Area:     24.20 cm LA Vol (A2C):   62.8 ml RA Volume:   75.30 ml LA Vol (A4C):   76.3 ml LA Biplane Vol: 69.4 ml  AORTIC VALVE AV Area (Vmax): 2.71 cm AV Vmax:        92.60 cm/s AV Peak Grad:   3.4 mmHg LVOT Vmax:      65.90 cm/s LVOT Vmean:     49.900 cm/s LVOT VTI:       0.110 m  AORTA Ao Root diam: 3.60 cm Ao Asc diam:  3.10 cm MITRAL VALVE                TRICUSPID VALVE MV Area (PHT):  5.93 cm     TR Peak grad:   18.8 mmHg MV Decel Time: 128 msec     TR Vmax:        217.00 cm/s MR Peak grad: 57.0 mmHg MR Mean grad: 42.0 mmHg     SHUNTS MR Vmax:      377.50 cm/s   Systemic VTI:  0.11 m MR Vmean:     314.0 cm/s    Systemic Diam: 2.20 cm MV E velocity: 102.00 cm/s Zoila Shutter MD Electronically signed by Zoila Shutter MD Signature Date/Time: 07/17/2021/3:16:56 PM    Final     EKG: Independently reviewed.   0630 - Atrial flutter with 2:1 conduction with rate 151; nonspecific ST changes that may be rate related 1040 - Atrial flutter with 2:1 conduction with rate 150; prolonged QTc 508;NSCSLT 1256 - Atrial flutter with rate 87; no evidence of acute ischemia 1318 - Atrial flutter with rate 90; prolonged QTc 538; no evidence of acute ischemia   Labs on Admission: I have personally reviewed the available labs and imaging studies at the time of the admission.  Pertinent labs:    Glucose 118 BUN 28/Creatinine 0.98/GFR >60 AST 71/ALT 188 Normal CBC INR 1.1 COVID/flu negative    Assessment and Plan: * New onset atrial flutter (HCC) -Patient presenting with new-onset atrial flutter -Etiology is thought to be related to HTN in the setting of medication noncompliance; however, echo shows markedly reduced EF with global hypokinesis so Takotsubo and/or ischemic cardiomyopathy are also considerations -Cardioversion should be considered in patient with low LVEF and new-onset AF. -Will admit to SDU for Diltiazem drip as per protocol with plan to transition to PO Diltiazem once heart rate is controlled (resting HR 110 or lower); patient needs  admission based on unstable vital signs and/or afib associated with a high-risk situation (new onset CHF). -HS troponin pending x 2. -Echocardiogram performed for further evaluation and is grossly abnormal -Will risk stratify with FLP and HgbA1c; will also order TSH and UDS.  -Will consult cardiology  -Heart rate is well controlled on Dilt drip at  this time. -CHA2DS2-VASc Score is 1-2 (new CHF so likely 2 now) and so patient would benefit from oral anticoagulation.  -He was started on Xarelto by the EDP  HTN -Takes Toprol XL and Zestoretic at home; will hold while on Cardizem -Will also add prn hydralazine  HLD -Continue Zocor -Check lipids  New onset of congestive heart failure (HCC) -Echo is markedly abnormal -Appears to be indicative of Takotsubo cardiomyopathy, although ischemic cardiomyopathy is a consideration -Cardiology consult is pending -He does not have overt clinical CHF at this time  Alcohol abuse -Patient with chronic ETOH dependence -Number of drinks per day: 2-12 -He is at risk for complications of withdrawal including seizures, DTs -CIWA protocol -Folate, thiamine, and MVI ordered -Will provide symptom-triggered BZD (ativan per CIWA protocol) only since the patient is able to communicate; is not showing current signs of delirium; and has no history of severe withdrawal. -TOC team consult for substance abuse education -Will also check UDS. -Elevated LFTs are likely related to alcoholism, although they are not in the typical pattern for this; shock liver is another consideration, will follow. -Consider offering a medication for Alcohol Use Disorder at the time of d/c, to include Disulfuram; Naltrexone; or Acamprosate.  Prolonged QT interval -Will attempt to avoid QT-prolonging medications such as PPI, nausea meds, SSRIs -Repeat EKG in AM  DNR (do not resuscitate) -I have discussed code status with the patient and his sister and  they are in agreement that the patient would not desire resuscitation and would prefer to die a natural death should that situation arise.  Wheezing -Significant tobacco history -Will order Albuterol HFA -Likely needs outpatient COPD evaluation       Advance Care Planning:   Code Status: DNR   Consults: Cardiology; TOC team  DVT Prophylaxis: Xarelto  Family  Communication: Sister was present throughout evaluation  Severity of Illness: The appropriate patient status for this patient is INPATIENT. Inpatient status is judged to be reasonable and necessary in order to provide the required intensity of service to ensure the patient's safety. The patient's presenting symptoms, physical exam findings, and initial radiographic and laboratory data in the context of their chronic comorbidities is felt to place them at high risk for further clinical deterioration. Furthermore, it is not anticipated that the patient will be medically stable for discharge from the hospital within 2 midnights of admission.   * I certify that at the point of admission it is my clinical judgment that the patient will require inpatient hospital care spanning beyond 2 midnights from the point of admission due to high intensity of service, high risk for further deterioration and high frequency of surveillance required.*  Author: Jonah Blue, MD 07/17/2021 4:04 PM  For on call review [email or web address].

## 2021-07-17 NOTE — ED Notes (Signed)
EDP at [facility] 

## 2021-07-18 ENCOUNTER — Inpatient Hospital Stay: Payer: Self-pay

## 2021-07-18 ENCOUNTER — Encounter (HOSPITAL_COMMUNITY): Payer: Self-pay | Admitting: Cardiology

## 2021-07-18 ENCOUNTER — Encounter (HOSPITAL_COMMUNITY)
Admission: EM | Disposition: A | Discharge status: Home / Self Care | Payer: Self-pay | Source: Home / Self Care | Attending: Internal Medicine

## 2021-07-18 DIAGNOSIS — R570 Cardiogenic shock: Secondary | ICD-10-CM

## 2021-07-18 HISTORY — PX: RIGHT HEART CATH: CATH118263

## 2021-07-18 LAB — CBC
HCT: 48.1 % (ref 39.0–52.0)
Hemoglobin: 15.7 g/dL (ref 13.0–17.0)
MCH: 30.3 pg (ref 26.0–34.0)
MCHC: 32.6 g/dL (ref 30.0–36.0)
MCV: 92.7 fL (ref 80.0–100.0)
Platelets: 294 10*3/uL (ref 150–400)
RBC: 5.19 MIL/uL (ref 4.22–5.81)
RDW: 14 % (ref 11.5–15.5)
WBC: 9.5 10*3/uL (ref 4.0–10.5)
nRBC: 0 % (ref 0.0–0.2)

## 2021-07-18 LAB — COMPREHENSIVE METABOLIC PANEL
ALT: 191 U/L — ABNORMAL HIGH (ref 0–44)
AST: 93 U/L — ABNORMAL HIGH (ref 15–41)
Albumin: 3.3 g/dL — ABNORMAL LOW (ref 3.5–5.0)
Alkaline Phosphatase: 56 U/L (ref 38–126)
Anion gap: 8 (ref 5–15)
BUN: 43 mg/dL — ABNORMAL HIGH (ref 8–23)
CO2: 30 mmol/L (ref 22–32)
Calcium: 8.9 mg/dL (ref 8.9–10.3)
Chloride: 95 mmol/L — ABNORMAL LOW (ref 98–111)
Creatinine, Ser: 2.03 mg/dL — ABNORMAL HIGH (ref 0.61–1.24)
GFR, Estimated: 37 mL/min — ABNORMAL LOW (ref >60)
Glucose, Bld: 117 mg/dL — ABNORMAL HIGH (ref 70–99)
Potassium: 5.4 mmol/L — ABNORMAL HIGH (ref 3.5–5.1)
Sodium: 133 mmol/L — ABNORMAL LOW (ref 135–145)
Total Bilirubin: 1.2 mg/dL (ref 0.3–1.2)
Total Protein: 5.7 g/dL — ABNORMAL LOW (ref 6.5–8.1)

## 2021-07-18 LAB — BASIC METABOLIC PANEL
Anion gap: 14 (ref 5–15)
BUN: 44 mg/dL — ABNORMAL HIGH (ref 8–23)
CO2: 21 mmol/L — ABNORMAL LOW (ref 22–32)
Calcium: 8.6 mg/dL — ABNORMAL LOW (ref 8.9–10.3)
Chloride: 95 mmol/L — ABNORMAL LOW (ref 98–111)
Creatinine, Ser: 1.86 mg/dL — ABNORMAL HIGH (ref 0.61–1.24)
GFR, Estimated: 41 mL/min — ABNORMAL LOW (ref >60)
Glucose, Bld: 122 mg/dL — ABNORMAL HIGH (ref 70–99)
Potassium: 4.3 mmol/L (ref 3.5–5.1)
Sodium: 130 mmol/L — ABNORMAL LOW (ref 135–145)

## 2021-07-18 LAB — APTT: aPTT: 36 seconds (ref 24–36)

## 2021-07-18 LAB — COOXEMETRY PANEL
Carboxyhemoglobin: 1.8 % — ABNORMAL HIGH (ref 0.5–1.5)
Methemoglobin: <0.7 % (ref 0.0–1.5)
O2 Saturation: 68.4 %
Total hemoglobin: 14.8 g/dL (ref 12.0–16.0)

## 2021-07-18 LAB — RAPID URINE DRUG SCREEN, HOSP PERFORMED
Amphetamines: NOT DETECTED
Barbiturates: NOT DETECTED
Benzodiazepines: NOT DETECTED
Cocaine: NOT DETECTED
Opiates: NOT DETECTED
Tetrahydrocannabinol: NOT DETECTED

## 2021-07-18 LAB — MAGNESIUM: Magnesium: 2.2 mg/dL (ref 1.7–2.4)

## 2021-07-18 LAB — HEPARIN LEVEL (UNFRACTIONATED): Heparin Unfractionated: >1.1 IU/mL — ABNORMAL HIGH (ref 0.30–0.70)

## 2021-07-18 LAB — TROPONIN I (HIGH SENSITIVITY): Troponin I (High Sensitivity): 93 ng/L — ABNORMAL HIGH (ref <18)

## 2021-07-18 SURGERY — RIGHT HEART CATH
Anesthesia: LOCAL

## 2021-07-18 MED ORDER — FUROSEMIDE 10 MG/ML IJ SOLN
8.0000 mg/h | INTRAVENOUS | Status: DC
  Administered 2021-07-18 – 2021-07-19 (×2): 12 mg/h via INTRAVENOUS
  Administered 2021-07-20: 6 mg/h via INTRAVENOUS
  Filled 2021-07-18 (×4): qty 20

## 2021-07-18 MED ORDER — LIDOCAINE HCL (PF) 1 % IJ SOLN
INTRAMUSCULAR | Status: AC
  Filled 2021-07-18: qty 30

## 2021-07-18 MED ORDER — AMIODARONE HCL IN DEXTROSE 360-4.14 MG/200ML-% IV SOLN
60.0000 mg/h | INTRAVENOUS | Status: DC
  Administered 2021-07-18: 60 mg/h via INTRAVENOUS

## 2021-07-18 MED ORDER — DEXTROSE 5 % IV SOLN
INTRAVENOUS | Status: AC | PRN
  Administered 2021-07-18: 150 mg via INTRAVENOUS

## 2021-07-18 MED ORDER — MIDODRINE HCL 5 MG PO TABS: 5.0000 mg | ORAL_TABLET | Freq: Two times a day (BID) | ORAL | Status: DC

## 2021-07-18 MED ORDER — SODIUM CHLORIDE 0.9% FLUSH
10.0000 mL | Freq: Two times a day (BID) | INTRAVENOUS | Status: DC
  Administered 2021-07-19 – 2021-07-20 (×2): 10 mL
  Administered 2021-07-21: 20 mL
  Administered 2021-07-22: 10 mL
  Administered 2021-07-22: 40 mL

## 2021-07-18 MED ORDER — HEPARIN (PORCINE) IN NACL 1000-0.9 UT/500ML-% IV SOLN
INTRAVENOUS | Status: DC | PRN
  Administered 2021-07-18: 500 mL

## 2021-07-18 MED ORDER — MILRINONE LACTATE IN DEXTROSE 20-5 MG/100ML-% IV SOLN
0.1250 ug/kg/min | INTRAVENOUS | Status: DC
  Administered 2021-07-18 – 2021-07-20 (×8): 0.25 ug/kg/min via INTRAVENOUS
  Administered 2021-07-21: 0.125 ug/kg/min via INTRAVENOUS
  Administered 2021-07-21: 0.25 ug/kg/min via INTRAVENOUS
  Filled 2021-07-18 (×10): qty 100

## 2021-07-18 MED ORDER — SODIUM CHLORIDE 0.9 % IV SOLN: INTRAVENOUS | Status: DC

## 2021-07-18 MED ORDER — LIDOCAINE HCL (PF) 1 % IJ SOLN
INTRAMUSCULAR | Status: DC | PRN
  Administered 2021-07-18: 2 mL

## 2021-07-18 MED ORDER — SODIUM CHLORIDE 0.9 % IV SOLN
250.0000 mL | INTRAVENOUS | Status: DC | PRN
Start: 2021-07-18 — End: 2021-07-18

## 2021-07-18 MED ORDER — AMIODARONE HCL IN DEXTROSE 360-4.14 MG/200ML-% IV SOLN
INTRAVENOUS | Status: DC | PRN
  Administered 2021-07-18: 60 mg/h via INTRAVENOUS

## 2021-07-18 MED ORDER — AMIODARONE LOAD VIA INFUSION
150.0000 mg | Freq: Once | INTRAVENOUS | Status: DC
  Filled 2021-07-18: qty 83.34

## 2021-07-18 MED ORDER — CHLORHEXIDINE GLUCONATE CLOTH 2 % EX PADS
6.0000 | MEDICATED_PAD | Freq: Every day | CUTANEOUS | Status: DC
  Administered 2021-07-18 – 2021-07-22 (×4): 6 via TOPICAL

## 2021-07-18 MED ORDER — ROSUVASTATIN CALCIUM 20 MG PO TABS
20.0000 mg | ORAL_TABLET | Freq: Every day | ORAL | Status: DC
Start: 2021-07-18 — End: 2021-07-23
  Administered 2021-07-18 – 2021-07-23 (×6): 20 mg via ORAL
  Filled 2021-07-18 (×6): qty 1

## 2021-07-18 MED ORDER — SODIUM ZIRCONIUM CYCLOSILICATE 10 G PO PACK: 10.0000 g | PACK | Freq: Once | ORAL | Status: DC

## 2021-07-18 MED ORDER — ASPIRIN 81 MG PO CHEW: 81.0000 mg | CHEWABLE_TABLET | ORAL | Status: DC

## 2021-07-18 MED ORDER — AMIODARONE HCL IN DEXTROSE 360-4.14 MG/200ML-% IV SOLN
INTRAVENOUS | Status: AC
Start: 2021-07-18 — End: ?
  Filled 2021-07-18: qty 200

## 2021-07-18 MED ORDER — SODIUM CHLORIDE 0.9% FLUSH
3.0000 mL | Freq: Two times a day (BID) | INTRAVENOUS | Status: DC
  Administered 2021-07-18: 3 mL via INTRAVENOUS

## 2021-07-18 MED ORDER — SODIUM CHLORIDE 0.9% FLUSH: 3.0000 mL | INTRAVENOUS | Status: DC | PRN

## 2021-07-18 MED ORDER — ASPIRIN 81 MG PO CHEW
81.0000 mg | CHEWABLE_TABLET | ORAL | Status: AC
  Administered 2021-07-18: 81 mg via ORAL
  Filled 2021-07-18: qty 1

## 2021-07-18 MED ORDER — AMIODARONE HCL IN DEXTROSE 360-4.14 MG/200ML-% IV SOLN
30.0000 mg/h | INTRAVENOUS | Status: DC
  Administered 2021-07-18 – 2021-07-21 (×8): 30 mg/h via INTRAVENOUS
  Filled 2021-07-18 (×8): qty 200

## 2021-07-18 MED ORDER — SODIUM CHLORIDE 0.9% FLUSH: 10.0000 mL | INTRAVENOUS | Status: DC | PRN

## 2021-07-18 MED ORDER — FUROSEMIDE 10 MG/ML IJ SOLN
80.0000 mg | Freq: Once | INTRAMUSCULAR | Status: AC
  Administered 2021-07-18: 80 mg via INTRAVENOUS
  Filled 2021-07-18: qty 8

## 2021-07-18 SURGICAL SUPPLY — 6 items
CATH BALLN WEDGE 5F 110CM (CATHETERS) ×2 IMPLANT
GUIDEWIRE .025 260CM (WIRE) ×2 IMPLANT
PACK CARDIAC CATHETERIZATION (CUSTOM PROCEDURE TRAY) ×3 IMPLANT
SHEATH GLIDE SLENDER 4/5FR (SHEATH) ×2 IMPLANT
TRANSDUCER W/STOPCOCK (MISCELLANEOUS) ×3 IMPLANT
TUBING ART PRESS 72  MALE/MALE (TUBING) ×2 IMPLANT

## 2021-07-18 NOTE — Interval H&P Note (Signed)
History and Physical Interval Note: ? ?07/18/2021 ?11:01 AM ? ?Christopher Romero  has presented today for surgery, with the diagnosis of afib.  The various methods of treatment have been discussed with the patient and family. After consideration of risks, benefits and other options for treatment, the patient has consented to  Procedure(s): ?RIGHT HEART CATH (N/A) as a surgical intervention.  The patient's history has been reviewed, patient examined, no change in status, stable for surgery.  I have reviewed the patient's chart and labs.  Questions were answered to the patient's satisfaction.   ? ? ?Kista Robb Shirlee Latch ? ? ?

## 2021-07-18 NOTE — Consult Note (Addendum)
Advanced Heart Failure Team Consult Note   Primary Physician: Pcp, No PCP-Cardiologist:  Donato Schultz, MD  Reason for Consultation: New Systolic Heart Failure>>Low output   HPI:    Christopher Romero is seen today for evaluation of new systolic heart failure>>low output at the request of Dr. Anne Fu, Cardiology.   61 y/o male w/ PMH of HTN, HLD and obesity, no prior cardiac history, who presented to ED w/ compalints of several weeks of persistent and worsening generalized fatigue/ malaise and elevated HR. Found to be in Atrial Flutter w/ RVR, of unknown duration as well as acute CHF. EKG showed 2:1 flutter, rate 150 bpm. Initially placed on cardizem gtt for rate control. 2D echo showed severe biventricular dysfunction, LVEF 20-25% w/ global HK, RV moderately reduced, mod-severe MR. No apical thrombus w/ definity contrast. Cardizem gtt subsequently discontinued and placed on metoprolol but decompensated w/ drop in BP and bump in SCr, rasing concern for developing cardigenic shock. Metoprolol discontinued. Transitioned to amio gtt and placed on milrinone 0.25. He is scheduled for RHC today. AHF team consulted to assist w/ further management.   Hs trop 74.   TSH normal     Initial SCr 0.98>>bumped to 2.03. K 5.4 today   AST 71>>93 ALT 188>>191    Echo 07/17/21  No apical thrombus with Definity contrast. Left ventricular ejection fraction, by estimation, is 20 to 25%. Left ventricular ejection fraction by 2D MOD biplane is 24.2 %. The left ventricle has severely decreased function. The left ventricle demonstrates global hypokinesis. The left ventricular internal cavity size was severely dilated. Left ventricular diastolic function could not be evaluated. 1. Right ventricular systolic function is moderately reduced. The right ventricular size is mildly enlarged. There is normal pulmonary artery systolic pressure. The estimated right ventricular systolic pressure is 33.8 mmHg. 2. 3. Left  atrial size was mildly dilated. 4. The mitral valve is grossly normal. Moderate to severe mitral valve regurgitation. 5. The aortic valve is tricuspid. Aortic valve regurgitation is not visualized. The inferior vena cava is dilated in size with <50% respiratory variability, suggesting right atrial pressure of 15 mmHg.   Review of Systems: [y] = yes, [ ]  = no   General: Weight gain [ ] ; Weight loss [ ] ; Anorexia [ ] ; Fatigue [Y ]; Fever [ ] ; Chills [ ] ; Weakness [ Y]  Cardiac: Chest pain/pressure [ ] ; Resting SOB [ ] ; Exertional SOB [Y ]; Orthopnea [ ] ; Pedal Edema [ ] ; Palpitations [ ] ; Syncope [ ] ; Presyncope [ ] ; Paroxysmal nocturnal dyspnea[ ]   Pulmonary: Cough [ ] ; Wheezing[ ] ; Hemoptysis[ ] ; Sputum [ ] ; Snoring [ ]   GI: Vomiting[ ] ; Dysphagia[ ] ; Melena[ ] ; Hematochezia [ ] ; Heartburn[ ] ; Abdominal pain [ ] ; Constipation [ ] ; Diarrhea [ ] ; BRBPR [ ]   GU: Hematuria[ ] ; Dysuria [ ] ; Nocturia[ ]   Vascular: Pain in legs with walking [ ] ; Pain in feet with lying flat [ ] ; Non-healing sores [ ] ; Stroke [ ] ; TIA [ ] ; Slurred speech [ ] ;  Neuro: Headaches[ ] ; Vertigo[ ] ; Seizures[ ] ; Paresthesias[ ] ;Blurred vision [ ] ; Diplopia [ ] ; Vision changes [ ]   Ortho/Skin: Arthritis [ ] ; Joint pain [ ] ; Muscle pain [ ] ; Joint swelling [ ] ; Back Pain [ ] ; Rash [ ]   Psych: Depression[ ] ; Anxiety[ ]   Heme: Bleeding problems [ ] ; Clotting disorders [ ] ; Anemia [ ]   Endocrine: Diabetes [ ] ; Thyroid dysfunction[ ]   Home Medications Prior to Admission medications   Medication Sig  Start Date End Date Taking? Authorizing Provider  acetaminophen (TYLENOL) 500 MG tablet Take 1,000 mg by mouth every 6 (six) hours as needed for mild pain.   Yes [provider]  Ibuprofen-diphenhydrAMINE Cit (IBUPROFEN PM PO) Take 1-2 tablets by mouth at bedtime as needed (sleep and pain).   Yes [provider]  lisinopril-hydrochlorothiazide (ZESTORETIC) 20-12.5 MG tablet Take 1 tablet by mouth daily. 07/14/21  Yes  [provider]  simvastatin (ZOCOR) 40 MG tablet Take 40 mg by mouth daily. 07/14/21  Yes [provider]  metoprolol succinate (TOPROL-XL) 25 MG 24 hr tablet Take 25 mg by mouth daily. Patient not taking: Reported on 07/17/2021 07/14/21   [provider]    Past Medical History: Past Medical History:  Diagnosis Date   Dyslipidemia    Hypertension    Visual impairment    L is severely impaired, R is about 20/30    Past Surgical History: Past Surgical History:  Procedure Laterality Date   ANEURYSM COILING  2008   visual impairment, sees little out of the left   KNEE ARTHROSCOPY      Family History: Family History  Problem Relation Age of Onset   Dementia Mother    Parkinsonism Mother    Cancer Father    CAD Father 3   Hypertension Sister    CAD Sister    Hypertension Brother    CAD Other    Heart failure Other     Social History: Social History   Socioeconomic History   Marital status: Single    Spouse name: Not on file   Number of children: Not on file   Years of education: Not on file   Highest education level: Not on file  Occupational History   Occupation: foam Patent examiner  Tobacco Use   Smoking status: Former    Packs/day: 2.00    Years: 40.00    Pack years: 80.00    Types: Cigarettes   Smokeless tobacco: Never  Substance and Sexual Activity   Alcohol use: Yes    Comment: 2-12 daily, denies having a problem with ETOH   Drug use: Never   Sexual activity: Not on file  Other Topics Concern   Not on file  Social History Narrative   Not on file   Social Determinants of Health   Financial Resource Strain: Not on file  Food Insecurity: Not on file  Transportation Needs: Not on file  Physical Activity: Not on file  Stress: Not on file  Social Connections: Not on file    Allergies:  No Known Allergies  Objective:    Vital Signs:   Temp:  [97.6 F (36.4 C)] 97.6 F (36.4 C) (03/03 0829) Pulse Rate:   [73-135] 112 (03/03 0829) Resp:  [11-24] 15 (03/03 0829) BP: (46-159)/(34-141) 107/76 (03/03 0829) SpO2:  [90 %-99 %] 99 % (03/03 0829) Weight:  [122.5 kg-139.8 kg] 139.3 kg (03/03 0500) Last BM Date : 07/17/21  Weight change: Filed Weights   07/17/21 1900 07/17/21 1940 07/18/21 0500  Weight: 122.5 kg (!) 139.8 kg (!) 139.3 kg    Intake/Output:   Intake/Output Summary (Last 24 hours) at 07/18/2021 1003 Last data filed at 07/18/2021 0425 Gross per 24 hour  Intake 740 ml  Output 675 ml  Net 65 ml      Physical Exam    General:  Well appearing, obese WM. No resp difficulty HEENT: normal Neck: supple. Thick neck JVD not well visualized . Carotids 2+ bilat; no  bruits. No lymphadenopathy or thyromegaly appreciated. Cor: PMI nondisplaced. Irregularly irregular rhythm and rate. 3/6 MR murmur Lungs: decreased BS at the bases bilaterally  Abdomen: obese, soft, nontender, nondistended. No hepatosplenomegaly. No bruits or masses. Good bowel sounds. Extremities: no cyanosis, clubbing, rash, 1+ b/l LE edema Neuro: alert & orientedx3, cranial nerves grossly intact. moves all 4 extremities w/o difficulty. Affect pleasant   Telemetry   Atrial flutter 110s   EKG    AFL 117 bpm   Labs   Basic Metabolic Panel: Recent Labs  Lab 07/17/21 1028 07/18/21 0632  NA 133* 133*  K 4.0 5.4*  CL 98 95*  CO2 25 30  GLUCOSE 118* 117*  BUN 28* 43*  CREATININE 0.98 2.03*  CALCIUM 8.7* 8.9    Liver Function Tests: Recent Labs  Lab 07/17/21 1028 07/18/21 0632  AST 71* 93*  ALT 188* 191*  ALKPHOS 47 56  BILITOT 0.7 1.2  PROT 6.0* 5.7*  ALBUMIN 3.5 3.3*   No results for input(s): LIPASE, AMYLASE in the last 168 hours. No results for input(s): AMMONIA in the last 168 hours.  CBC: Recent Labs  Lab 07/17/21 1028 07/18/21 0632  WBC 8.6 9.5  NEUTROABS 5.9  --   HGB 15.8 15.7  HCT 48.9 48.1  MCV 94.0 92.7  PLT 335 294    Cardiac Enzymes: No results for input(s): CKTOTAL, CKMB,  CKMBINDEX, TROPONINI in the last 168 hours.  BNP: BNP (last 3 results) No results for input(s): BNP in the last 8760 hours.  ProBNP (last 3 results) No results for input(s): PROBNP in the last 8760 hours.   CBG: Recent Labs  Lab 07/17/21 2221  GLUCAP 173*    Coagulation Studies: Recent Labs    07/17/21 1028  LABPROT 14.6  INR 1.1     Imaging   DG Chest Port 1 View  Result Date: 07/17/2021 CLINICAL DATA:  62 year old male with tachycardia, palpitations, some shortness of breath. EXAM: PORTABLE CHEST 1 VIEW COMPARISON:  None. FINDINGS: Portable AP upright view at 1044 hours. Mild cardiomegaly. Other mediastinal contours are within normal limits. Visualized tracheal air column is within normal limits. No definite pleural effusion. Allowing for portable technique the lungs are clear. No pneumothorax. No acute osseous abnormality identified. IMPRESSION: Borderline to mild cardiomegaly. No acute cardiopulmonary abnormality. Electronically Signed   By: Genevie Ann M.D.   On: 07/17/2021 10:54   ECHOCARDIOGRAM COMPLETE  Result Date: 07/17/2021    ECHOCARDIOGRAM REPORT   Patient Name:   Christopher Romero Date of Exam: 07/17/2021 Medical Rec #:  [id number]     Height: Accession #:    [id number]    Weight: Date of Birth:  11-21-1960    BSA: Patient Age:    19 years      BP:           103/80 mmHg Patient Gender: M             HR:           79 bpm. Exam Location:  Inpatient Procedure: 2D Echo, Color Doppler, Cardiac Doppler and Intracardiac            Opacification Agent Indications:    Afib  History:        Patient has no prior history of Echocardiogram examinations.                 Risk Factors:Hypertension and Dyslipidemia.  Sonographer:    Jyl Heinz Referring Phys: [place]  1. No  apical thrombus with Definity contrast. Left ventricular ejection fraction, by estimation, is 20 to 25%. Left ventricular ejection fraction by 2D MOD biplane is 24.2 %. The left ventricle has severely  decreased function. The left ventricle demonstrates global hypokinesis. The left ventricular internal cavity size was severely dilated. Left ventricular diastolic function could not be evaluated.  2. Right ventricular systolic function is moderately reduced. The right ventricular size is mildly enlarged. There is normal pulmonary artery systolic pressure. The estimated right ventricular systolic pressure is 123XX123 mmHg.  3. Left atrial size was mildly dilated.  4. The mitral valve is grossly normal. Moderate to severe mitral valve regurgitation.  5. The aortic valve is tricuspid. Aortic valve regurgitation is not visualized.  6. The inferior vena cava is dilated in size with <50% respiratory variability, suggesting right atrial pressure of 15 mmHg. Comparison(s): No prior Echocardiogram. FINDINGS  Left Ventricle: No apical thrombus with Definity contrast. Left ventricular ejection fraction, by estimation, is 20 to 25%. Left ventricular ejection fraction by 2D MOD biplane is 24.2 %. The left ventricle has severely decreased function. The left ventricle demonstrates global hypokinesis. The left ventricular internal cavity size was severely dilated. There is no left ventricular hypertrophy. Left ventricular diastolic function could not be evaluated due to atrial fibrillation. Left ventricular diastolic function could not be evaluated. Right Ventricle: The right ventricular size is mildly enlarged. No increase in right ventricular wall thickness. Right ventricular systolic function is moderately reduced. There is normal pulmonary artery systolic pressure. The tricuspid regurgitant velocity is 2.17 m/s, and with an assumed right atrial pressure of 15 mmHg, the estimated right ventricular systolic pressure is 123XX123 mmHg. Left Atrium: Left atrial size was mildly dilated. Right Atrium: Right atrial size was normal in size. Pericardium: There is no evidence of pericardial effusion. Mitral Valve: The mitral valve is grossly  normal. Moderate to severe mitral valve regurgitation, with posteriorly-directed jet. Tricuspid Valve: The tricuspid valve is grossly normal. Tricuspid valve regurgitation is mild. Aortic Valve: The aortic valve is tricuspid. Aortic valve regurgitation is not visualized. Aortic valve peak gradient measures 3.4 mmHg. Pulmonic Valve: The pulmonic valve was normal in structure. Pulmonic valve regurgitation is not visualized. Aorta: The aortic root and ascending aorta are structurally normal, with no evidence of dilitation. Venous: The inferior vena cava is dilated in size with less than 50% respiratory variability, suggesting right atrial pressure of 15 mmHg. IAS/Shunts: No atrial level shunt detected by color flow Doppler.  LEFT VENTRICLE PLAX 2D                        Biplane EF (MOD) LVIDd:         6.70 cm         LV Biplane EF:   Left LVIDs:         5.50 cm                          ventricular LV PW:         1.10 cm                          ejection LV IVS:        1.00 cm                          fraction by LVOT diam:     2.20 cm  2D MOD LV SV:         42                               biplane is LVOT Area:     3.80 cm                         24.2 %.                                 Diastology LV Volumes (MOD)               LV e' medial:    6.85 cm/s LV vol d, MOD    216.0 ml      LV E/e' medial:  14.9 A2C:                           LV e' lateral:   8.92 cm/s LV vol d, MOD    210.0 ml      LV E/e' lateral: 11.4 A4C: LV vol s, MOD    160.0 ml A2C: LV vol s, MOD    160.0 ml A4C: LV SV MOD A2C:   56.0 ml LV SV MOD A4C:   210.0 ml LV SV MOD BP:    52.1 ml RIGHT VENTRICLE            IVC RV Basal diam:  4.20 cm    IVC diam: 2.90 cm RV Mid diam:    2.90 cm RV S prime:     5.66 cm/s TAPSE (M-mode): 1.4 cm LEFT ATRIUM             RIGHT ATRIUM LA diam:        4.80 cm RA Area:     24.20 cm LA Vol (A2C):   62.8 ml RA Volume:   75.30 ml LA Vol (A4C):   76.3 ml LA Biplane Vol: 69.4 ml  AORTIC VALVE AV  Area (Vmax): 2.71 cm AV Vmax:        92.60 cm/s AV Peak Grad:   3.4 mmHg LVOT Vmax:      65.90 cm/s LVOT Vmean:     49.900 cm/s LVOT VTI:       0.110 m  AORTA Ao Root diam: 3.60 cm Ao Asc diam:  3.10 cm MITRAL VALVE                TRICUSPID VALVE MV Area (PHT): 5.93 cm     TR Peak grad:   18.8 mmHg MV Decel Time: 128 msec     TR Vmax:        217.00 cm/s MR Peak grad: 57.0 mmHg MR Mean grad: 42.0 mmHg     SHUNTS MR Vmax:      377.50 cm/s   Systemic VTI:  0.11 m MR Vmean:     314.0 cm/s    Systemic Diam: 2.20 cm MV E velocity: 102.00 cm/s Lyman Bishop MD Electronically signed by Lyman Bishop MD Signature Date/Time: 07/17/2021/3:16:56 PM    Final      Medications:     Current Medications:  aspirin  81 mg Oral Pre-Cath   folic acid  1 mg Oral Daily   multivitamin with minerals  1 tablet Oral Daily   simvastatin  40 mg Oral Daily  sodium chloride flush  3 mL Intravenous Q12H   sodium zirconium cyclosilicate  10 g Oral Once   thiamine  100 mg Oral Daily   Or   thiamine  100 mg Intravenous Daily    Infusions:  sodium chloride     sodium chloride     heparin        Patient Profile   61 y/o male w/ HTN, HLD and obesity, but no prior cardiac history, admitted w/ AFL w/ RVR of unknown duration and found to have biventricular heart failure w/ developing cardiogenic shock. 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 Current AKI, prohibits coronary angiography - Will plan RHC today  - Will need central access to help follow co-ox to guide milrinone dosing +  CVP monitoring - diuresis per RHC measurements - will need TEE/DCCV to restore NSR  - GDMT currently limited by AKI and hyperkalemia. BP too soft for Bidil - once back in NSR, will need repeat echo to reassess LVEF. If not improving, will need LHC to exclude underlying CAD, if renal function recovers   2. Atrial Flutter w/ RVR - unknown duration, V-rates 150s on admit. TSH ok  - continue amio gtt  - Will need TEE/DCCV if no chemical conversion   - Heparin gtt, transition to DOAC after procedures - keep K >4.0 and Mg > 2.0  - suspect OSA. Will need outpatient sleep study  - Review EKGs w/ EP to see if ablateable   3. AKI - suspect cardiorenal/low output, also hypotensive yesterday after ? blocker was given  - SCr 0.98>>2.03 - continue milrinone to support cardiac output - plan RHC today to assess hemodynamics - avoid further hypotension (BP now stable, normotensive)  - monitor UOP   5. Hyperkalemia - K 5.4 in setting of AKI   - Lokelma 10 mg  - follow BMP   6. Mitral Regurgitation  - Mod-severe on TTE  - suspect functional in setting of dilated CM - HF optimization per above   7. Obesity - Body mass index is 44.05 kg/m. - wt loss advised    Length of Stay: 1  Brittainy Simmons, PA-C  07/18/2021, 10:03 AM  Advanced Heart Failure Team Pager [phone] (M-F; 7a - 5p)  Please contact [facility] for night-coverage after hours (4p -7a ) and weekends on [email or web address]  Patient seen with PA, agree with the above note.   Patient was admitted with tachypalpitations and dyspnea, no prior heart disease.  Noted to be in atrial flutter with RVR.  Echo was done, showing EF 20-25%, global hypokinesis, severe LV dilation, mildly dilated and moderately dysfunctional RV, moderate-severe functional MR, dilated IVC.    Patient's initial creatinine was 0.98, increased to 2.0 today.  He has not been diuresed.   Patient is a former smoker, moderate-heavy ETOH.   RHC Procedural Findings  (today): Hemodynamics (mmHg) RA mean 21 RV 46/22 PA 47/39, mean 40 PCWP mean 30 Oxygen saturations: PA 62% AO 99% Cardiac Output (Fick) 4.21  Cardiac Index (Fick) 1.68 PVR 2.4 WU  General:  NAD Neck: Thick, JVP 16+, no thyromegaly or thyroid nodule.  Lungs: Clear to auscultation bilaterally with normal respiratory effort. CV: Lateral PMI.  Heart irregular S1/S2, no S3/S4, 1/6 HSM apex.  1+ edema to knees.  No carotid bruit.  Normal pedal pulses.  Abdomen: Soft, nontender, no hepatosplenomegaly, mild distention.  Skin: Intact without lesions or rashes.  Neurologic: Alert and oriented x 3.  Psych: Normal affect. Extremities: No clubbing or cyanosis.  HEENT: Normal.   1. Acute on chronic systolic CHF:  Patient has no prior history of cardiac disease. Admitted with several weeks of fatigue, palpitations.  Noted to be in atrial flutter with RVR.  Echo with EF 20-25%, global hypokinesis, severe LV dilation, mildly dilated and moderately dysfunctional RV, moderate-severe functional MR, dilated IVC.  He developed AKI with creatinine rising 0.98 => 2 without aggressive diuresis. RHC today showed severely elevated right and left heart filling pressures with low cardiac output.  Cause of cardiomyopathy uncertain.  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  However, need to rule out CAD (prior smoker, HTN).  Initial troponin mildly elevated.  Viral myocarditis would also be a consideration.   - With low output, start milrinone 0.25 mcg/kg/min.  - Control HR with amiodarone gtt.  - Lasix 80 mg IV x 1 after milrinone started then 12 mg/hr.  - Place PICC to follow CVP and co-ox.  - Will need coronary angiography, but need stabilization of creatinine first (?Monday if creatinine trends down on milrinone).  - After diuresis, will need DCCV back to NSR.  - If no significant coronary disease on cath, will need cMRI.  2. Atrial flutter: With RVR.  May have been present several weeks prior to  admission.  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  - Start amiodarone gtt.  - Continue heparin gtt.  - Plan for TEE-guided DCCV after coronary angiography and diuresis.  3. AKI: Suspect cardiorenal in setting of low output HF.   - Maintain cardiac output and MAP.  - Had dose of Lokelma for hyperkalemia.  4. Mitral regurgitation: Moderate-severe, suspect functional.  - Reassess MR after diuresis by TEE at time of DCCV.   Loralie Champagne 07/18/2021 11:47 AM

## 2021-07-18 NOTE — Progress Notes (Signed)
?   07/17/21 2133  ?Assess: MEWS Score  ?BP (!) 80/57  ?ECG Heart Rate 72  ?Resp 18  ?Level of Consciousness Alert  ?O2 Device Room Air  ?Assess: MEWS Score  ?MEWS Temp 0  ?MEWS Systolic 2  ?MEWS Pulse 0  ?MEWS RR 0  ?MEWS LOC 0  ?MEWS Score 2  ?MEWS Score Color Yellow  ?Assess: if the MEWS score is Yellow or Red  ?Were vital signs taken at a resting state? Yes  ?Focused Assessment Change from prior assessment (see assessment flowsheet)  ?Early Detection of Sepsis Score *See Row Information* Low  ?MEWS guidelines implemented *See Row Information* Yes  ?Treat  ?Pain Scale 0-10  ?Pain Score 0  ?Complains of Other (Comment) ?(dizzy, flushed)  ?Interventions Other (comment) ?(D/C'ed Cardizem drip)  ?Patients response to intervention Decreased  ?Take Vital Signs  ?Increase Vital Sign Frequency  Yellow: Q 2hr X 2 then Q 4hr X 2, if remains yellow, continue Q 4hrs  ?Escalate  ?MEWS: Escalate Yellow: discuss with charge nurse/RN and consider discussing with provider and RRT  ?Notify: Charge Nurse/RN  ?Name of Charge Nurse/RN Notified Taten Merrow  ?Date Charge Nurse/RN Notified 07/18/21  ?Time Charge Nurse/RN Notified 2133  ?Notify: Provider  ?Provider Name/Title Narcisse/ MD  ?Date Provider Notified 07/17/21  ?Time Provider Notified 2240  ?Notification Type Page  ?Notification Reason Change in status  ?Provider response Evaluate remotely;See new orders  ?Date of Provider Response 07/17/21  ?Time of Provider Response 2245  ?Document  ?Patient Outcome Stabilized after interventions  ?Progress note created (see row info) Yes  ? ? ?

## 2021-07-18 NOTE — TOC Initial Note (Signed)
Transition of Care (TOC) - Initial/Assessment Note  ? ? ?Patient Details  ?Name: Christopher Romero ?MRN: [id number] ?Date of Birth: 1960-10-03 ? ?Transition of Care [facility]) CM/SW Contact:    ?Mariea Stable Davene Costain, RN ?Phone Number: [phone] ?07/18/2021, 4:18 PM ? ?Clinical Narrative:                 ?HF TOC CM spoke to pt and sister at bedside. States he lives with friend. He was independent prior to hospital stay. Has family and friends that will assist with transportation. Has PCP, Dr Christy Gentles. Will continue to follow for dc needs.  ? ?Expected Discharge Plan: Home/Self Care ?Barriers to Discharge: Continued Medical Work up ? ? ?Patient Goals and CMS Choice ?Patient states their goals for this hospitalization and ongoing recovery are:: wants to get back to get better ?CMS Medicare.gov Compare Post Acute Care list provided to:: Patient ?  ? ?Expected Discharge Plan and Services ?Expected Discharge Plan: Home/Self Care ?  ?Discharge Planning Services: CM Consult ?  ?Living arrangements for the past 2 months: Single Family Home ? ? ?Prior Living Arrangements/Services ?Living arrangements for the past 2 months: Single Family Home ?Lives with:: Friends ?Patient language and need for interpreter reviewed:: Yes ?Do you feel safe going back to the place where you live?: Yes      ?Need for Family Participation in Patient Care: No (Comment) ?Care giver support system in place?: No (comment) ?  ?Criminal Activity/Legal Involvement Pertinent to Current Situation/Hospitalization: No - Comment as needed ? ?Activities of Daily Living ?Home Assistive Devices/Equipment: Eyeglasses ?ADL Screening (condition at time of admission) ?Patient's cognitive ability adequate to safely complete daily activities?: Yes ?Is the patient deaf or have difficulty hearing?: No ?Does the patient have difficulty seeing, even when wearing glasses/contacts?: No ?Does the patient have difficulty concentrating, remembering, or making decisions?: No ?Patient able to  express need for assistance with ADLs?: Yes ?Does the patient have difficulty dressing or bathing?: No ?Independently performs ADLs?: Yes (appropriate for developmental age) ?Does the patient have difficulty walking or climbing stairs?: No ?Weakness of Legs: None ?Weakness of Arms/Hands: None ? ?Permission Sought/Granted ?Permission sought to share information with : Case Manager, Family Supports, PCP ?Permission granted to share information with : Yes, Verbal Permission Granted ? Share Information with NAME: Talmadge Chad ?   ? Permission granted to share info w Relationship: sister ? Permission granted to share info w Contact Information: [phone] ? ?Emotional Assessment ?Appearance:: Appears stated age ?Attitude/Demeanor/Rapport: Engaged ?Affect (typically observed): Accepting ?Orientation: : Oriented to Self, Oriented to Place, Oriented to  Time, Oriented to Situation ?  ?Psych Involvement: No (comment) ? ?Admission diagnosis:  Tachycardia [R00.0] ?New onset atrial fibrillation (HCC) [I48.91] ?Typical atrial flutter (HCC) [I48.3] ?New onset atrial flutter (HCC) [I48.92] ?Patient Active Problem List  ? Diagnosis Date Noted  ? New onset atrial flutter (HCC) 07/17/2021  ? Dyslipidemia 07/17/2021  ? Hypertension 07/17/2021  ? New onset of congestive heart failure (HCC) 07/17/2021  ? Wheezing 07/17/2021  ? Prolonged QT interval 07/17/2021  ? Alcohol abuse 07/17/2021  ? DNR (do not resuscitate) 07/17/2021  ? ?PCP:  Pcp, No ?Pharmacy:   ?[id number] Rosalita Levan, [place] - [address] AT [facility] [address] ?[address] ?[place] [address] ?Phone: [phone] Fax: [phone] ? ? ? ? ?Social Determinants of Health (SDOH) Interventions ?  ? ?Readmission Risk Interventions ?No flowsheet data found. ? ? ?

## 2021-07-18 NOTE — Progress Notes (Addendum)
Progress Note  Patient Name: Christopher Romero Date of Encounter: 07/18/2021  [facility] Cardiologist: New (Dr. Marlou Porch)  Subjective   Patient sleeping soundly when I walked in. He denies any complaints at this time. No shortness of breath. Laying almost completely flat without any issues. No chest pain. No palpitations - not aware of rapid atrial fibrillation/flutter.  BP dropped to as low as 56/39 last night at receiving dose of Lopressor 50mg . He had some headache and dizziness with this but symptoms improved as BP improved. Most recent BP in system 107/76. Creatinine up from 0.98 yesterday to 2.03 today. Concerned for low output CHF.  Inpatient Medications    Scheduled Meds:  amiodarone  150 mg Intravenous Once   folic acid  1 mg Oral Daily   multivitamin with minerals  1 tablet Oral Daily   simvastatin  40 mg Oral Daily   sodium chloride flush  3 mL Intravenous Q12H   sodium chloride flush  3 mL Intravenous Q12H   sodium zirconium cyclosilicate  10 g Oral Once   thiamine  100 mg Oral Daily   Or   thiamine  100 mg Intravenous Daily   Continuous Infusions:  sodium chloride     sodium chloride 10 mL/hr at 07/18/21 1010   amiodarone     Followed by   amiodarone     heparin     milrinone     PRN Meds: sodium chloride, acetaminophen **OR** acetaminophen, albuterol, LORazepam **OR** LORazepam, ondansetron **OR** ondansetron (ZOFRAN) IV, sodium chloride flush   Vital Signs    Vitals:   07/18/21 0430 07/18/21 0500 07/18/21 0748 07/18/21 0829  BP:  93/78 117/78 107/76  Pulse:  (!) 110 (!) 113 (!) 112  Resp: 15 18 (!) 23 15  Temp:  97.6 F (36.4 C) 97.6 F (36.4 C) 97.6 F (36.4 C)  TempSrc:  Oral Oral Oral  SpO2:  97% 98% 99%  Weight:  (!) 139.3 kg    Height:        Intake/Output Summary (Last 24 hours) at 07/18/2021 1026 Last data filed at 07/18/2021 0425 Gross per 24 hour  Intake 740 ml  Output 675 ml  Net 65 ml   Last 3 Weights 07/18/2021 07/17/2021 07/17/2021   Weight (lbs) 307 lb 308 lb 3.3 oz 270 lb  Weight (kg) 139.254 kg 139.8 kg 122.471 kg      Telemetry    Course atrial fibrillation vs atrial flutter with rates in the 120s. - Personally Reviewed  ECG    Atrial fibrillation/flutter, rate 117 bpm, with non-specific T wave changes. - Personally Reviewed  Physical Exam   GEN: No acute distress.   Neck: No JVD. Cardiac: Tachycardic with irregularly irregular rhythm. No murmurs, rubs, or gallops.  Respiratory: Clear to auscultation bilaterally. No wheezes, rhonchi, or rales. GI: Soft, mildly distended, and non-tender. MS: No lower extremity edema. No deformity. Skin: Cool to the touch. Neuro:  No focal deficits. Psych: Normal affect. Responds appropriately.  Labs    High Sensitivity Troponin:   Recent Labs  Lab 07/17/21 1815  TROPONINIHS 74*     Chemistry Recent Labs  Lab 07/17/21 1028 07/18/21 0632  NA 133* 133*  K 4.0 5.4*  CL 98 95*  CO2 25 30  GLUCOSE 118* 117*  BUN 28* 43*  CREATININE 0.98 2.03*  CALCIUM 8.7* 8.9  PROT 6.0* 5.7*  ALBUMIN 3.5 3.3*  AST 71* 93*  ALT 188* 191*  ALKPHOS 47 56  BILITOT 0.7 1.2  GFRNONAA >60 37*  ANIONGAP 10 8    Lipids  Recent Labs  Lab 07/17/21 1815  CHOL 152  TRIG 96  HDL 18*  LDLCALC 115*  CHOLHDL 8.4    Hematology Recent Labs  Lab 07/17/21 1028 07/18/21 0632  WBC 8.6 9.5  RBC 5.20 5.19  HGB 15.8 15.7  HCT 48.9 48.1  MCV 94.0 92.7  MCH 30.4 30.3  MCHC 32.3 32.6  RDW 14.0 14.0  PLT 335 294   Thyroid  Recent Labs  Lab 07/17/21 1815  TSH 1.535    BNPNo results for input(s): BNP, PROBNP in the last 168 hours.  DDimer No results for input(s): DDIMER in the last 168 hours.   Radiology    DG Chest Port 1 View  Result Date: 07/17/2021 CLINICAL DATA:  61 year old male with tachycardia, palpitations, some shortness of breath. EXAM: PORTABLE CHEST 1 VIEW COMPARISON:  None. FINDINGS: Portable AP upright view at 1044 hours. Mild cardiomegaly. Other  mediastinal contours are within normal limits. Visualized tracheal air column is within normal limits. No definite pleural effusion. Allowing for portable technique the lungs are clear. No pneumothorax. No acute osseous abnormality identified. IMPRESSION: Borderline to mild cardiomegaly. No acute cardiopulmonary abnormality. Electronically Signed   By: Genevie Ann M.D.   On: 07/17/2021 10:54   ECHOCARDIOGRAM COMPLETE  Result Date: 07/17/2021    ECHOCARDIOGRAM REPORT   Patient Name:   Christopher Romero Date of Exam: 07/17/2021 Medical Rec #:  [id number]     Height: Accession #:    [id number]    Weight: Date of Birth:  Dec 16, 1960    BSA: Patient Age:    61 years      BP:           103/80 mmHg Patient Gender: M             HR:           79 bpm. Exam Location:  Inpatient Procedure: 2D Echo, Color Doppler, Cardiac Doppler and Intracardiac            Opacification Agent Indications:    Afib  History:        Patient has no prior history of Echocardiogram examinations.                 Risk Factors:Hypertension and Dyslipidemia.  Sonographer:    Jyl Heinz Referring Phys: [place]  1. No apical thrombus with Definity contrast. Left ventricular ejection fraction, by estimation, is 20 to 25%. Left ventricular ejection fraction by 2D MOD biplane is 24.2 %. The left ventricle has severely decreased function. The left ventricle demonstrates global hypokinesis. The left ventricular internal cavity size was severely dilated. Left ventricular diastolic function could not be evaluated.  2. Right ventricular systolic function is moderately reduced. The right ventricular size is mildly enlarged. There is normal pulmonary artery systolic pressure. The estimated right ventricular systolic pressure is 123XX123 mmHg.  3. Left atrial size was mildly dilated.  4. The mitral valve is grossly normal. Moderate to severe mitral valve regurgitation.  5. The aortic valve is tricuspid. Aortic valve regurgitation is not visualized.  6.  The inferior vena cava is dilated in size with <50% respiratory variability, suggesting right atrial pressure of 15 mmHg. Comparison(s): No prior Echocardiogram. FINDINGS  Left Ventricle: No apical thrombus with Definity contrast. Left ventricular ejection fraction, by estimation, is 20 to 25%. Left ventricular ejection fraction by 2D MOD biplane is 24.2 %. The left ventricle has severely decreased  function. The left ventricle demonstrates global hypokinesis. The left ventricular internal cavity size was severely dilated. There is no left ventricular hypertrophy. Left ventricular diastolic function could not be evaluated due to atrial fibrillation. Left ventricular diastolic function could not be evaluated. Right Ventricle: The right ventricular size is mildly enlarged. No increase in right ventricular wall thickness. Right ventricular systolic function is moderately reduced. There is normal pulmonary artery systolic pressure. The tricuspid regurgitant velocity is 2.17 m/s, and with an assumed right atrial pressure of 15 mmHg, the estimated right ventricular systolic pressure is 123XX123 mmHg. Left Atrium: Left atrial size was mildly dilated. Right Atrium: Right atrial size was normal in size. Pericardium: There is no evidence of pericardial effusion. Mitral Valve: The mitral valve is grossly normal. Moderate to severe mitral valve regurgitation, with posteriorly-directed jet. Tricuspid Valve: The tricuspid valve is grossly normal. Tricuspid valve regurgitation is mild. Aortic Valve: The aortic valve is tricuspid. Aortic valve regurgitation is not visualized. Aortic valve peak gradient measures 3.4 mmHg. Pulmonic Valve: The pulmonic valve was normal in structure. Pulmonic valve regurgitation is not visualized. Aorta: The aortic root and ascending aorta are structurally normal, with no evidence of dilitation. Venous: The inferior vena cava is dilated in size with less than 50% respiratory variability, suggesting right  atrial pressure of 15 mmHg. IAS/Shunts: No atrial level shunt detected by color flow Doppler.  LEFT VENTRICLE PLAX 2D                        Biplane EF (MOD) LVIDd:         6.70 cm         LV Biplane EF:   Left LVIDs:         5.50 cm                          ventricular LV PW:         1.10 cm                          ejection LV IVS:        1.00 cm                          fraction by LVOT diam:     2.20 cm                          2D MOD LV SV:         42                               biplane is LVOT Area:     3.80 cm                         24.2 %.                                 Diastology LV Volumes (MOD)               LV e' medial:    6.85 cm/s LV vol d, MOD    216.0 ml      LV E/e' medial:  14.9 A2C:  LV e' lateral:   8.92 cm/s LV vol d, MOD    210.0 ml      LV E/e' lateral: 11.4 A4C: LV vol s, MOD    160.0 ml A2C: LV vol s, MOD    160.0 ml A4C: LV SV MOD A2C:   56.0 ml LV SV MOD A4C:   210.0 ml LV SV MOD BP:    52.1 ml RIGHT VENTRICLE            IVC RV Basal diam:  4.20 cm    IVC diam: 2.90 cm RV Mid diam:    2.90 cm RV S prime:     5.66 cm/s TAPSE (M-mode): 1.4 cm LEFT ATRIUM             RIGHT ATRIUM LA diam:        4.80 cm RA Area:     24.20 cm LA Vol (A2C):   62.8 ml RA Volume:   75.30 ml LA Vol (A4C):   76.3 ml LA Biplane Vol: 69.4 ml  AORTIC VALVE AV Area (Vmax): 2.71 cm AV Vmax:        92.60 cm/s AV Peak Grad:   3.4 mmHg LVOT Vmax:      65.90 cm/s LVOT Vmean:     49.900 cm/s LVOT VTI:       0.110 m  AORTA Ao Root diam: 3.60 cm Ao Asc diam:  3.10 cm MITRAL VALVE                TRICUSPID VALVE MV Area (PHT): 5.93 cm     TR Peak grad:   18.8 mmHg MV Decel Time: 128 msec     TR Vmax:        217.00 cm/s MR Peak grad: 57.0 mmHg MR Mean grad: 42.0 mmHg     SHUNTS MR Vmax:      377.50 cm/s   Systemic VTI:  0.11 m MR Vmean:     314.0 cm/s    Systemic Diam: 2.20 cm MV E velocity: 102.00 cm/s Lyman Bishop MD Electronically signed by Lyman Bishop MD Signature Date/Time:  07/17/2021/3:16:56 PM    Final     Cardiac Studies   Echocardiogram 07/17/2021: Impressions:  1. No apical thrombus with Definity contrast. Left ventricular ejection  fraction, by estimation, is 20 to 25%. Left ventricular ejection fraction  by 2D MOD biplane is 24.2 %. The left ventricle has severely decreased  function. The left ventricle  demonstrates global hypokinesis. The left ventricular internal cavity size  was severely dilated. Left ventricular diastolic function could not be  evaluated.   2. Right ventricular systolic function is moderately reduced. The right  ventricular size is mildly enlarged. There is normal pulmonary artery  systolic pressure. The estimated right ventricular systolic pressure is  123XX123 mmHg.   3. Left atrial size was mildly dilated.   4. The mitral valve is grossly normal. Moderate to severe mitral valve  regurgitation.   5. The aortic valve is tricuspid. Aortic valve regurgitation is not  visualized.   6. The inferior vena cava is dilated in size with <50% respiratory  variability, suggesting right atrial pressure of 15 mmHg.   Comparison(s): No prior Echocardiogram.   Patient Profile     61 y.o. male with a history of hypertension, hyperlipidemia, and obesity but no known cardiac history prior to admission who was admitted on 07/17/2021 with new onset rapid atrial fibrillation and new cardiomyopathy with EF of 20-25% after presenting with general sense  of feeling unwell and elevated heart rate.  Assessment & Plan    New Onset Atrial Fibrillation/Flutter Remains in atrial fibrillation/flutter with rates in the 120s. Initially started on IV Cardizem but this was stopped due to low EF. He was then transitioned to Lopressor but became very hypotensive after one dose of this yesterday. Concerned for low out put CHF. - Potassium 5.4 today. - Will check Magnesium. - TSH normal. - Will stop beta-blocker and start IV Amiodarone with bolus. - Continue IV  Heparin. Will need to switch to Eliquis after all procedures are done. - May ultimately need TEE/DCCV this admission.  New Diagnosis of Cardiomyopathy  Concern for Low Output CHF Echo this admission showed LVEF of 20-25% with global hypokinesis. RV mildly enlarged with moderately reduced systolic function and normal PASP. Also showed moderate to severe MR.  - Does not appear significantly volume overloaded but cool on exam. With significant rise in creatinine and low BP, concerned for low output CHF. - Will stop beta-blocker and start Milrinone 0.52mcg/kg/min. - No ACE/ARB/ARNI or MRA given AKI. - No Digoxin given AKI. - Will continue to adjust GDMT as BP and renal function allow. - Etiology possibly tachymediated but will need to rule out CAD. Patient was initially planned for [facility] today but given AKI, will on perform RHC. Dr. Aundra Dubin will perform procedure and then see in consult. Appreciative of their help. Will hold on placing PICC for now given plans for RHC later today.  AKI Creatinine 0.98 on admission and up to 2.03. Suspect low output CHF. - Starting Milrinone as above. - Continue to monitor closely.  Hyperkalemia Potassium 4.0 on admission and up to 5.4 today in setting of AKI.  Milly Jakob has been ordered.  Elevated LFTs AST and ALT mildly elevated at 93 and 191 respectively (slightly up from yesterday). Suspect secondary to low output CHF and hepatic congestion.   For questions or updates, please contact [place] Please consult [email or web address] for contact info under        Signed, Candee Furbish, MD  07/18/2021, 10:26 AM    Personally seen and examined. Agree with above.  61 year old with new diagnosis of low output heart failure, ejection fraction 2025% with moderate to severe mitral regurgitation likely secondary to dilatation.  This morning, creatinine increased, acute kidney injury.  He has not received any diuretics.  On exam cold, mildly increased respiratory  effort.  Blood pressure dropped to as low as 55 systolic last night.  His metoprolol tartrate was stopped.  Lab work reviewed.  Creatinine 0.98 up to 2.03, troponin 74, potassium 5.4 serum sodium 133, LDL 115, hemoglobin 15.7, TSH normal 1.5  Echocardiogram personally reviewed shows no evidence of apical thrombus.  EF 20% RV function also moderately reduced.  Moderate to severe MR.  Assessment and plan:  Cardiogenic shock/low output heart failure/new onset acute systolic heart failure - Plan today was to perform left and right heart catheterization however with acute kidney injury with creatinine rising to 2 from 1, we will hold off on left heart catheterization and perform right heart catheterization with the help of Dr. Aundra Dubin of advanced heart failure team.  This will also help understand his output as well as overall volume status. Clinically appears to be in low output failure. -Yesterday did not do well with metoprolol 50 mg which was being utilized to help with his heart rate of 120 atrial fibrillation/flutter. -We will go ahead and initiate milrinone 0.25 mcg/kg/min. -Appreciate assistance from advanced heart failure  team  Atrial fibrillation/flutter - We will go ahead and proceed with IV amiodarone.  Watch for any signs of hypotension.  Did not tolerate metoprolol well given his low output heart failure.  AKI - As described above.  Poor perfusion to kidneys  Hyperkalemia - Receiving Lokelma  Elevated LFTs - Result of cardiogenic shock/low output heart failure  CRITICAL CARE Performed by: Candee Furbish   Total critical care time: 40 minutes  Critical care time was exclusive of separately billable procedures and treating other patients.  Critical care was necessary to treat or prevent imminent or life-threatening deterioration.  Critical care was time spent personally by me on the following activities: development of treatment plan with patient and/or surrogate as well as  nursing, discussions with consultants, evaluation of patient's response to treatment, examination of patient, obtaining history from patient or surrogate, ordering and performing treatments and interventions, ordering and review of laboratory studies, ordering and review of radiographic studies, pulse oximetry and re-evaluation of patient's condition.   Candee Furbish, MD

## 2021-07-18 NOTE — Progress Notes (Addendum)
?   07/18/21 1136  ?Vitals  ?Temp 98.2 ?F (36.8 ?C)  ?Temp Source Oral  ?BP 117/89  ?MAP (mmHg) 97  ?BP Location Left Arm  ?BP Method Automatic  ?Patient Position (if appropriate) Lying  ?Pulse Rate (!) 134  ?Pulse Rate Source Monitor  ?ECG Heart Rate (!) 134  ?Resp 16  ?MEWS COLOR  ?MEWS Score Color Yellow  ?Oxygen Therapy  ?SpO2 97 %  ?O2 Device Room Air  ?Pain Assessment  ?Pain Scale 0-10  ?Pain Score 0  ?MEWS Score  ?MEWS Temp 0  ?MEWS Systolic 0  ?MEWS Pulse 3  ?MEWS RR 0  ?MEWS LOC 0  ?MEWS Score 3  ? ?Patient is a chronic yellow MEWS due to elevated HR upon admission. MD is aware. Patient came back cath lab with amiodarone bolus and amiodarone continuous gtt afterwards. Patient is asymptomatic.  ?

## 2021-07-18 NOTE — H&P (View-Only) (Signed)
Progress Note  Patient Name: Christopher Romero Date of Encounter: 07/18/2021  [facility] Cardiologist: New (Dr. Marlou Porch)  Subjective   Patient sleeping soundly when I walked in. He denies any complaints at this time. No shortness of breath. Laying almost completely flat without any issues. No chest pain. No palpitations - not aware of rapid atrial fibrillation/flutter.  BP dropped to as low as 56/39 last night at receiving dose of Lopressor 50mg . He had some headache and dizziness with this but symptoms improved as BP improved. Most recent BP in system 107/76. Creatinine up from 0.98 yesterday to 2.03 today. Concerned for low output CHF.  Inpatient Medications    Scheduled Meds:  amiodarone  150 mg Intravenous Once   folic acid  1 mg Oral Daily   multivitamin with minerals  1 tablet Oral Daily   simvastatin  40 mg Oral Daily   sodium chloride flush  3 mL Intravenous Q12H   sodium chloride flush  3 mL Intravenous Q12H   sodium zirconium cyclosilicate  10 g Oral Once   thiamine  100 mg Oral Daily   Or   thiamine  100 mg Intravenous Daily   Continuous Infusions:  sodium chloride     sodium chloride 10 mL/hr at 07/18/21 1010   amiodarone     Followed by   amiodarone     heparin     milrinone     PRN Meds: sodium chloride, acetaminophen **OR** acetaminophen, albuterol, LORazepam **OR** LORazepam, ondansetron **OR** ondansetron (ZOFRAN) IV, sodium chloride flush   Vital Signs    Vitals:   07/18/21 0430 07/18/21 0500 07/18/21 0748 07/18/21 0829  BP:  93/78 117/78 107/76  Pulse:  (!) 110 (!) 113 (!) 112  Resp: 15 18 (!) 23 15  Temp:  97.6 F (36.4 C) 97.6 F (36.4 C) 97.6 F (36.4 C)  TempSrc:  Oral Oral Oral  SpO2:  97% 98% 99%  Weight:  (!) 139.3 kg    Height:        Intake/Output Summary (Last 24 hours) at 07/18/2021 1026 Last data filed at 07/18/2021 0425 Gross per 24 hour  Intake 740 ml  Output 675 ml  Net 65 ml   Last 3 Weights 07/18/2021 07/17/2021 07/17/2021   Weight (lbs) 307 lb 308 lb 3.3 oz 270 lb  Weight (kg) 139.254 kg 139.8 kg 122.471 kg      Telemetry    Course atrial fibrillation vs atrial flutter with rates in the 120s. - Personally Reviewed  ECG    Atrial fibrillation/flutter, rate 117 bpm, with non-specific T wave changes. - Personally Reviewed  Physical Exam   GEN: No acute distress.   Neck: No JVD. Cardiac: Tachycardic with irregularly irregular rhythm. No murmurs, rubs, or gallops.  Respiratory: Clear to auscultation bilaterally. No wheezes, rhonchi, or rales. GI: Soft, mildly distended, and non-tender. MS: No lower extremity edema. No deformity. Skin: Cool to the touch. Neuro:  No focal deficits. Psych: Normal affect. Responds appropriately.  Labs    High Sensitivity Troponin:   Recent Labs  Lab 07/17/21 1815  TROPONINIHS 74*     Chemistry Recent Labs  Lab 07/17/21 1028 07/18/21 0632  NA 133* 133*  K 4.0 5.4*  CL 98 95*  CO2 25 30  GLUCOSE 118* 117*  BUN 28* 43*  CREATININE 0.98 2.03*  CALCIUM 8.7* 8.9  PROT 6.0* 5.7*  ALBUMIN 3.5 3.3*  AST 71* 93*  ALT 188* 191*  ALKPHOS 47 56  BILITOT 0.7 1.2  GFRNONAA >60 37*  ANIONGAP 10 8    Lipids  Recent Labs  Lab 07/17/21 1815  CHOL 152  TRIG 96  HDL 18*  LDLCALC 115*  CHOLHDL 8.4    Hematology Recent Labs  Lab 07/17/21 1028 07/18/21 0632  WBC 8.6 9.5  RBC 5.20 5.19  HGB 15.8 15.7  HCT 48.9 48.1  MCV 94.0 92.7  MCH 30.4 30.3  MCHC 32.3 32.6  RDW 14.0 14.0  PLT 335 294   Thyroid  Recent Labs  Lab 07/17/21 1815  TSH 1.535    BNPNo results for input(s): BNP, PROBNP in the last 168 hours.  DDimer No results for input(s): DDIMER in the last 168 hours.   Radiology    DG Chest Port 1 View  Result Date: 07/17/2021 CLINICAL DATA:  61 year old male with tachycardia, palpitations, some shortness of breath. EXAM: PORTABLE CHEST 1 VIEW COMPARISON:  None. FINDINGS: Portable AP upright view at 1044 hours. Mild cardiomegaly. Other  mediastinal contours are within normal limits. Visualized tracheal air column is within normal limits. No definite pleural effusion. Allowing for portable technique the lungs are clear. No pneumothorax. No acute osseous abnormality identified. IMPRESSION: Borderline to mild cardiomegaly. No acute cardiopulmonary abnormality. Electronically Signed   By: Genevie Ann M.D.   On: 07/17/2021 10:54   ECHOCARDIOGRAM COMPLETE  Result Date: 07/17/2021    ECHOCARDIOGRAM REPORT   Patient Name:   Christopher Romero Date of Exam: 07/17/2021 Medical Rec #:  [id number]     Height: Accession #:    [id number]    Weight: Date of Birth:  05/02/1961    BSA: Patient Age:    61 years      BP:           103/80 mmHg Patient Gender: M             HR:           79 bpm. Exam Location:  Inpatient Procedure: 2D Echo, Color Doppler, Cardiac Doppler and Intracardiac            Opacification Agent Indications:    Afib  History:        Patient has no prior history of Echocardiogram examinations.                 Risk Factors:Hypertension and Dyslipidemia.  Sonographer:    Jyl Heinz Referring Phys: [place]  1. No apical thrombus with Definity contrast. Left ventricular ejection fraction, by estimation, is 20 to 25%. Left ventricular ejection fraction by 2D MOD biplane is 24.2 %. The left ventricle has severely decreased function. The left ventricle demonstrates global hypokinesis. The left ventricular internal cavity size was severely dilated. Left ventricular diastolic function could not be evaluated.  2. Right ventricular systolic function is moderately reduced. The right ventricular size is mildly enlarged. There is normal pulmonary artery systolic pressure. The estimated right ventricular systolic pressure is 123XX123 mmHg.  3. Left atrial size was mildly dilated.  4. The mitral valve is grossly normal. Moderate to severe mitral valve regurgitation.  5. The aortic valve is tricuspid. Aortic valve regurgitation is not visualized.  6.  The inferior vena cava is dilated in size with <50% respiratory variability, suggesting right atrial pressure of 15 mmHg. Comparison(s): No prior Echocardiogram. FINDINGS  Left Ventricle: No apical thrombus with Definity contrast. Left ventricular ejection fraction, by estimation, is 20 to 25%. Left ventricular ejection fraction by 2D MOD biplane is 24.2 %. The left ventricle has severely decreased  function. The left ventricle demonstrates global hypokinesis. The left ventricular internal cavity size was severely dilated. There is no left ventricular hypertrophy. Left ventricular diastolic function could not be evaluated due to atrial fibrillation. Left ventricular diastolic function could not be evaluated. Right Ventricle: The right ventricular size is mildly enlarged. No increase in right ventricular wall thickness. Right ventricular systolic function is moderately reduced. There is normal pulmonary artery systolic pressure. The tricuspid regurgitant velocity is 2.17 m/s, and with an assumed right atrial pressure of 15 mmHg, the estimated right ventricular systolic pressure is 123XX123 mmHg. Left Atrium: Left atrial size was mildly dilated. Right Atrium: Right atrial size was normal in size. Pericardium: There is no evidence of pericardial effusion. Mitral Valve: The mitral valve is grossly normal. Moderate to severe mitral valve regurgitation, with posteriorly-directed jet. Tricuspid Valve: The tricuspid valve is grossly normal. Tricuspid valve regurgitation is mild. Aortic Valve: The aortic valve is tricuspid. Aortic valve regurgitation is not visualized. Aortic valve peak gradient measures 3.4 mmHg. Pulmonic Valve: The pulmonic valve was normal in structure. Pulmonic valve regurgitation is not visualized. Aorta: The aortic root and ascending aorta are structurally normal, with no evidence of dilitation. Venous: The inferior vena cava is dilated in size with less than 50% respiratory variability, suggesting right  atrial pressure of 15 mmHg. IAS/Shunts: No atrial level shunt detected by color flow Doppler.  LEFT VENTRICLE PLAX 2D                        Biplane EF (MOD) LVIDd:         6.70 cm         LV Biplane EF:   Left LVIDs:         5.50 cm                          ventricular LV PW:         1.10 cm                          ejection LV IVS:        1.00 cm                          fraction by LVOT diam:     2.20 cm                          2D MOD LV SV:         42                               biplane is LVOT Area:     3.80 cm                         24.2 %.                                 Diastology LV Volumes (MOD)               LV e' medial:    6.85 cm/s LV vol d, MOD    216.0 ml      LV E/e' medial:  14.9 A2C:  LV e' lateral:   8.92 cm/s LV vol d, MOD    210.0 ml      LV E/e' lateral: 11.4 A4C: LV vol s, MOD    160.0 ml A2C: LV vol s, MOD    160.0 ml A4C: LV SV MOD A2C:   56.0 ml LV SV MOD A4C:   210.0 ml LV SV MOD BP:    52.1 ml RIGHT VENTRICLE            IVC RV Basal diam:  4.20 cm    IVC diam: 2.90 cm RV Mid diam:    2.90 cm RV S prime:     5.66 cm/s TAPSE (M-mode): 1.4 cm LEFT ATRIUM             RIGHT ATRIUM LA diam:        4.80 cm RA Area:     24.20 cm LA Vol (A2C):   62.8 ml RA Volume:   75.30 ml LA Vol (A4C):   76.3 ml LA Biplane Vol: 69.4 ml  AORTIC VALVE AV Area (Vmax): 2.71 cm AV Vmax:        92.60 cm/s AV Peak Grad:   3.4 mmHg LVOT Vmax:      65.90 cm/s LVOT Vmean:     49.900 cm/s LVOT VTI:       0.110 m  AORTA Ao Root diam: 3.60 cm Ao Asc diam:  3.10 cm MITRAL VALVE                TRICUSPID VALVE MV Area (PHT): 5.93 cm     TR Peak grad:   18.8 mmHg MV Decel Time: 128 msec     TR Vmax:        217.00 cm/s MR Peak grad: 57.0 mmHg MR Mean grad: 42.0 mmHg     SHUNTS MR Vmax:      377.50 cm/s   Systemic VTI:  0.11 m MR Vmean:     314.0 cm/s    Systemic Diam: 2.20 cm MV E velocity: 102.00 cm/s Lyman Bishop MD Electronically signed by Lyman Bishop MD Signature Date/Time:  07/17/2021/3:16:56 PM    Final     Cardiac Studies   Echocardiogram 07/17/2021: Impressions:  1. No apical thrombus with Definity contrast. Left ventricular ejection  fraction, by estimation, is 20 to 25%. Left ventricular ejection fraction  by 2D MOD biplane is 24.2 %. The left ventricle has severely decreased  function. The left ventricle  demonstrates global hypokinesis. The left ventricular internal cavity size  was severely dilated. Left ventricular diastolic function could not be  evaluated.   2. Right ventricular systolic function is moderately reduced. The right  ventricular size is mildly enlarged. There is normal pulmonary artery  systolic pressure. The estimated right ventricular systolic pressure is  123XX123 mmHg.   3. Left atrial size was mildly dilated.   4. The mitral valve is grossly normal. Moderate to severe mitral valve  regurgitation.   5. The aortic valve is tricuspid. Aortic valve regurgitation is not  visualized.   6. The inferior vena cava is dilated in size with <50% respiratory  variability, suggesting right atrial pressure of 15 mmHg.   Comparison(s): No prior Echocardiogram.   Patient Profile     61 y.o. male with a history of hypertension, hyperlipidemia, and obesity but no known cardiac history prior to admission who was admitted on 07/17/2021 with new onset rapid atrial fibrillation and new cardiomyopathy with EF of 20-25% after presenting with general sense  of feeling unwell and elevated heart rate.  Assessment & Plan    New Onset Atrial Fibrillation/Flutter Remains in atrial fibrillation/flutter with rates in the 120s. Initially started on IV Cardizem but this was stopped due to low EF. He was then transitioned to Lopressor but became very hypotensive after one dose of this yesterday. Concerned for low out put CHF. - Potassium 5.4 today. - Will check Magnesium. - TSH normal. - Will stop beta-blocker and start IV Amiodarone with bolus. - Continue IV  Heparin. Will need to switch to Eliquis after all procedures are done. - May ultimately need TEE/DCCV this admission.  New Diagnosis of Cardiomyopathy  Concern for Low Output CHF Echo this admission showed LVEF of 20-25% with global hypokinesis. RV mildly enlarged with moderately reduced systolic function and normal PASP. Also showed moderate to severe MR.  - Does not appear significantly volume overloaded but cool on exam. With significant rise in creatinine and low BP, concerned for low output CHF. - Will stop beta-blocker and start Milrinone 0.81mcg/kg/min. - No ACE/ARB/ARNI or MRA given AKI. - No Digoxin given AKI. - Will continue to adjust GDMT as BP and renal function allow. - Etiology possibly tachymediated but will need to rule out CAD. Patient was initially planned for [facility] today but given AKI, will on perform RHC. Dr. Aundra Dubin will perform procedure and then see in consult. Appreciative of their help. Will hold on placing PICC for now given plans for RHC later today.  AKI Creatinine 0.98 on admission and up to 2.03. Suspect low output CHF. - Starting Milrinone as above. - Continue to monitor closely.  Hyperkalemia Potassium 4.0 on admission and up to 5.4 today in setting of AKI.  Milly Jakob has been ordered.  Elevated LFTs AST and ALT mildly elevated at 93 and 191 respectively (slightly up from yesterday). Suspect secondary to low output CHF and hepatic congestion.   For questions or updates, please contact [place] Please consult [email or web address] for contact info under        Signed, Candee Furbish, MD  07/18/2021, 10:26 AM    Personally seen and examined. Agree with above.  61 year old with new diagnosis of low output heart failure, ejection fraction 2025% with moderate to severe mitral regurgitation likely secondary to dilatation.  This morning, creatinine increased, acute kidney injury.  He has not received any diuretics.  On exam cold, mildly increased respiratory  effort.  Blood pressure dropped to as low as 55 systolic last night.  His metoprolol tartrate was stopped.  Lab work reviewed.  Creatinine 0.98 up to 2.03, troponin 74, potassium 5.4 serum sodium 133, LDL 115, hemoglobin 15.7, TSH normal 1.5  Echocardiogram personally reviewed shows no evidence of apical thrombus.  EF 20% RV function also moderately reduced.  Moderate to severe MR.  Assessment and plan:  Cardiogenic shock/low output heart failure/new onset acute systolic heart failure - Plan today was to perform left and right heart catheterization however with acute kidney injury with creatinine rising to 2 from 1, we will hold off on left heart catheterization and perform right heart catheterization with the help of Dr. Aundra Dubin of advanced heart failure team.  This will also help understand his output as well as overall volume status. Clinically appears to be in low output failure. -Yesterday did not do well with metoprolol 50 mg which was being utilized to help with his heart rate of 120 atrial fibrillation/flutter. -We will go ahead and initiate milrinone 0.25 mcg/kg/min. -Appreciate assistance from advanced heart failure  team  Atrial fibrillation/flutter - We will go ahead and proceed with IV amiodarone.  Watch for any signs of hypotension.  Did not tolerate metoprolol well given his low output heart failure.  AKI - As described above.  Poor perfusion to kidneys  Hyperkalemia - Receiving Lokelma  Elevated LFTs - Result of cardiogenic shock/low output heart failure  CRITICAL CARE Performed by: Candee Furbish   Total critical care time: 40 minutes  Critical care time was exclusive of separately billable procedures and treating other patients.  Critical care was necessary to treat or prevent imminent or life-threatening deterioration.  Critical care was time spent personally by me on the following activities: development of treatment plan with patient and/or surrogate as well as  nursing, discussions with consultants, evaluation of patient's response to treatment, examination of patient, obtaining history from patient or surrogate, ordering and performing treatments and interventions, ordering and review of laboratory studies, ordering and review of radiographic studies, pulse oximetry and re-evaluation of patient's condition.   Candee Furbish, MD

## 2021-07-18 NOTE — Progress Notes (Signed)
Peripherally Inserted Central Catheter Placement ? ?The IV Nurse has discussed with the patient and/or persons authorized to consent for the patient, the purpose of this procedure and the potential benefits and risks involved with this procedure.  The benefits include less needle sticks, lab draws from the catheter, and the patient may be discharged home with the catheter. Risks include, but not limited to, infection, bleeding, blood clot (thrombus formation), and puncture of an artery; nerve damage and irregular heartbeat and possibility to perform a PICC exchange if needed/ordered by physician.  Alternatives to this procedure were also discussed.  Bard Power PICC patient education guide, fact sheet on infection prevention and patient information card has been provided to patient /or left at bedside.   ? ?PICC Placement Documentation  ?PICC Triple Lumen Q000111Q Left Basilic 47 cm 0 cm (Active)  ?Indication for Insertion or Continuance of Line Vasoactive infusions 07/18/21 1514  ?Exposed Catheter (cm) 0 cm 07/18/21 1514  ?Site Assessment Clean, Dry, Intact 07/18/21 1514  ?Lumen #1 Status Flushed;Saline locked;Blood return noted 07/18/21 1514  ?Lumen #2 Status Blood return noted;Saline locked;Flushed 07/18/21 1514  ?Lumen #3 Status Blood return noted;Saline locked;Flushed 07/18/21 1514  ?Dressing Type Securing device;Transparent 07/18/21 1514  ?Dressing Status Antimicrobial disc in place 07/18/21 1514  ?Safety Lock Not Applicable Q000111Q [id number]  ?Dressing Change Due 07/25/21 07/18/21 1514  ? ? ? ? ? ?Christopher Romero ?07/18/2021, 3:18 PM ? ?

## 2021-07-18 NOTE — Progress Notes (Signed)
ANTICOAGULATION CONSULT NOTE ? ?Pharmacy Consult for Heparin ?Indication: atrial fibrillation ? ?No Known Allergies ? ?Patient Measurements: ?Height: 5\' 10"  (177.8 cm) ?Weight: (!) 139.3 kg (307 lb) ?IBW/kg (Calculated) : 73 ? ?Heparin Dosing Weight: 106 kg ? ?Vital Signs: ?Temp: 97.7 ?F (36.5 ?C) (03/03 2037) ?Temp Source: Oral (03/03 2037) ?BP: 160/70 (03/03 2037) ?Pulse Rate: 92 (03/03 2037) ? ?Labs: ?Recent Labs  ?  07/17/21 ?1028 07/17/21 ?1815 07/18/21 ?09/17/21 07/18/21 ?1309 07/18/21 ?2000  ?HGB 15.8  --  15.7  --   --   ?HCT 48.9  --  48.1  --   --   ?PLT 335  --  294  --   --   ?APTT  --   --   --   --  36  ?LABPROT 14.6  --   --   --   --   ?INR 1.1  --   --   --   --   ?HEPARINUNFRC  --   --   --   --  >1.10*  ?CREATININE 0.98  --  2.03* 1.86*  --   ?TROPONINIHS  --  74*  --  93*  --   ? ? ?Estimated Creatinine Clearance: 58.7 mL/min (A) (by C-G formula based on SCr of 1.86 mg/dL (H)). ? ? ?Assessment: ?43 YOM presenting with hypertension, new onset afib w/RVR on diltiazem gtt, he is not on anticoagulation PTA however was started on Xarelto in ED last dose 3/02 @1238 , now plans for [facility]. Pharmacy consulted to manage heparin. ? ?Heparin level still artificially elevated due to recent Xarelto dose. aPTT level subtherapeutic with aPTT 36 secs. No signs/symptoms of bleeding. No reported issues with heparin infusion. Will increase heparin and recheck level in 6 hours. ? ? ?Goal of Therapy:  ?Heparin level 0.3-0.7 units/ml ?aPTT 66-102 seconds ?Monitor platelets by anticoagulation protocol: Yes ?  ?Plan:  ?Increase heparin infusion to 1600 units/hr ?Check heparin level in 6 hours and daily while on heparin ?Continue to monitor H&H and platelets ? ? ? ?Thank you for allowing pharmacy to be a part of this patient?s care. ? ? , PharmD ?Clinical Pharmacist ? ?

## 2021-07-18 NOTE — Plan of Care (Signed)

## 2021-07-18 NOTE — Progress Notes (Addendum)
PROGRESS NOTE    Christopher Romero  ZOX:[id number] DOB: 1960/06/30 DOA: 07/17/2021 PCP: Pcp, No  Narrative: 60/M with history of hypertension and dyslipidemia presented to the ED with palpitations, ran out of meds in January, kept feeling funny went to his PCP recently noted to have abnormal liver enzymes, continued to have increased heart rate and shortness of breath.  Presented to the ED noted to be in atrial flutter, with heart rate in the 1 30-1 40 range, also noted to have CHF. -Echo noted EF of 20-25% -Cardiology following , 3/2 he was switched from IV Cardizem to p.o. metoprolol, subsequently dropped BP to 60-70s overnight, followed by bump in creatinine to 2.0 today, metoprolol discontinued, transition to amnio and milrinone gtt.   Subjective: -Feels tired but okay otherwise, denies significant dyspnea  Assessment and Plan:  Acute systolic CHF Moderate to severe MR low output state, ?  Cardiogenic shock -Echo noted EF of 20-25%, suspect cardiomyopathy related to EtOH versus atrial flutter -Advanced heart failure team consulting, plan for right heart cath today to guide further therapies -Anticipate need for TEE DC cardioversion as well -Left heart cath on hold -Repeat labs this afternoon  Atrial flutter with RVR -Heart rate in the 130-150 range on admission -BP dropped after metoprolol, now on amiodarone gtt. -Would likely need TEE/DC cardioversion -Continue IV heparin  Acute kidney injury Hyperkalemia -Likely secondary to cardiorenal, low output state, and component of ATN from hypotension -Baseline creatinine is normal, starting milrinone today -Avoid hypotension, BP stable now  Abnormal LFTs -Likely secondary to passive hepatic congestion, low output, AST ALT pattern not consistent with EtOH -Continue to trend  Obesity -BMI 44 -Needs weight loss, lifestyle modification  EtOH use -Counseled, drinks 2 beers every day and 6-10 on weekends -Continue thiamine, monitor  for withdrawal  DVT prophylaxis: IV heparin Code Status: DNR Family Communication: No family at bedside Disposition Plan: Home pending cardiac work-up  Consultants:  Cardiology, heart failure  Procedures:   Antimicrobials:    Objective: Vitals:   07/18/21 0500 07/18/21 0748 07/18/21 0829 07/18/21 1038  BP: 93/78 117/78 107/76   Pulse: (!) 110 (!) 113 (!) 112   Resp: 18 (!) 23 15   Temp: 97.6 F (36.4 C) 97.6 F (36.4 C) 97.6 F (36.4 C)   TempSrc: Oral Oral Oral   SpO2: 97% 98% 99% 100%  Weight: (!) 139.3 kg     Height:        Intake/Output Summary (Last 24 hours) at 07/18/2021 1109 Last data filed at 07/18/2021 0425 Gross per 24 hour  Intake 240 ml  Output 675 ml  Net -435 ml   Filed Weights   07/17/21 1900 07/17/21 1940 07/18/21 0500  Weight: 122.5 kg (!) 139.8 kg (!) 139.3 kg    Examination:  General exam: Pleasant obese male sitting up in bed, AAOx3, no distress HEENT: Positive JVD CVS: S1-S2, irregularly irregular rhythm Lungs: Poor air movement bilaterally otherwise clear Abdomen: Obese, soft, nontender, bowel sounds present Extremities: Trace edema  Skin: No rashes Psychiatry: Judgement and insight appear normal. Mood & affect appropriate.     Data Reviewed:   CBC: Recent Labs  Lab 07/17/21 1028 07/18/21 0632  WBC 8.6 9.5  NEUTROABS 5.9  --   HGB 15.8 15.7  HCT 48.9 48.1  MCV 94.0 92.7  PLT 335 294   Basic Metabolic Panel: Recent Labs  Lab 07/17/21 1028 07/18/21 0632  NA 133* 133*  K 4.0 5.4*  CL 98 95*  CO2 25  30  GLUCOSE 118* 117*  BUN 28* 43*  CREATININE 0.98 2.03*  CALCIUM 8.7* 8.9   GFR: Estimated Creatinine Clearance: 53.8 mL/min (A) (by C-G formula based on SCr of 2.03 mg/dL (H)). Liver Function Tests: Recent Labs  Lab 07/17/21 1028 07/18/21 0632  AST 71* 93*  ALT 188* 191*  ALKPHOS 47 56  BILITOT 0.7 1.2  PROT 6.0* 5.7*  ALBUMIN 3.5 3.3*   No results for input(s): LIPASE, AMYLASE in the last 168 hours. No  results for input(s): AMMONIA in the last 168 hours. Coagulation Profile: Recent Labs  Lab 07/17/21 1028  INR 1.1   Cardiac Enzymes: No results for input(s): CKTOTAL, CKMB, CKMBINDEX, TROPONINI in the last 168 hours. BNP (last 3 results) No results for input(s): PROBNP in the last 8760 hours. HbA1C: Recent Labs    07/17/21 1815  HGBA1C 5.6   CBG: Recent Labs  Lab 07/17/21 2221  GLUCAP 173*   Lipid Profile: Recent Labs    07/17/21 1815  CHOL 152  HDL 18*  LDLCALC 115*  TRIG 96  CHOLHDL 8.4   Thyroid Function Tests: Recent Labs    07/17/21 1815  TSH 1.535   Anemia Panel: No results for input(s): VITAMINB12, FOLATE, FERRITIN, TIBC, IRON, RETICCTPCT in the last 72 hours. Urine analysis: No results found for: COLORURINE, APPEARANCEUR, LABSPEC, PHURINE, GLUCOSEU, HGBUR, BILIRUBINUR, KETONESUR, PROTEINUR, UROBILINOGEN, NITRITE, LEUKOCYTESUR Sepsis Labs: @LABRCNTIP (procalcitonin:4,lacticidven:4)  ) Recent Results (from the past 240 hour(s))  Resp Panel by RT-PCR (Flu A&B, Covid) Nasopharyngeal Swab     Status: None   Collection Time: 07/17/21 10:23 AM   Specimen: Nasopharyngeal Swab; Nasopharyngeal(NP) swabs in vial transport medium  Result Value Ref Range Status   SARS Coronavirus 2 by RT PCR NEGATIVE NEGATIVE Final    Comment: (NOTE) SARS-CoV-2 target nucleic acids are NOT DETECTED.  The SARS-CoV-2 RNA is generally detectable in upper respiratory specimens during the acute phase of infection. The lowest concentration of SARS-CoV-2 viral copies this assay can detect is 138 copies/mL. A negative result does not preclude SARS-Cov-2 infection and should not be used as the sole basis for treatment or other patient management decisions. A negative result may occur with  improper specimen collection/handling, submission of specimen other than nasopharyngeal swab, presence of viral mutation(s) within the areas targeted by this assay, and inadequate number of  viral copies(<138 copies/mL). A negative result must be combined with clinical observations, patient history, and epidemiological information. The expected result is Negative.  Fact Sheet for Patients:  BloggerCourse.com  Fact Sheet for Healthcare Providers:  SeriousBroker.it  This test is no t yet approved or cleared by the [place] FDA and  has been authorized for detection and/or diagnosis of SARS-CoV-2 by FDA under an Emergency Use Authorization (EUA). This EUA will remain  in effect (meaning this test can be used) for the duration of the COVID-19 declaration under Section 564(b)(1) of the Act, 21 U.S.C.section 360bbb-3(b)(1), unless the authorization is terminated  or revoked sooner.       Influenza A by PCR NEGATIVE NEGATIVE Final   Influenza B by PCR NEGATIVE NEGATIVE Final    Comment: (NOTE) The Xpert Xpress SARS-CoV-2/FLU/RSV plus assay is intended as an aid in the diagnosis of influenza from Nasopharyngeal swab specimens and should not be used as a sole basis for treatment. Nasal washings and aspirates are unacceptable for Xpert Xpress SARS-CoV-2/FLU/RSV testing.  Fact Sheet for Patients: BloggerCourse.com  Fact Sheet for Healthcare Providers: SeriousBroker.it  This test is not yet approved or  cleared by the [place] and has been authorized for detection and/or diagnosis of SARS-CoV-2 by FDA under an Emergency Use Authorization (EUA). This EUA will remain in effect (meaning this test can be used) for the duration of the COVID-19 declaration under Section 564(b)(1) of the Act, 21 U.S.C. section 360bbb-3(b)(1), unless the authorization is terminated or revoked.  Performed at [facility], [address], [place], [place] [address]      Radiology Studies: DG Chest [place] 1 View  Result Date: 07/17/2021 CLINICAL DATA:  60 year old male with  tachycardia, palpitations, some shortness of breath. EXAM: PORTABLE CHEST 1 VIEW COMPARISON:  None. FINDINGS: Portable AP upright view at 1044 hours. Mild cardiomegaly. Other mediastinal contours are within normal limits. Visualized tracheal air column is within normal limits. No definite pleural effusion. Allowing for portable technique the lungs are clear. No pneumothorax. No acute osseous abnormality identified. IMPRESSION: Borderline to mild cardiomegaly. No acute cardiopulmonary abnormality. Electronically Signed   By: Odessa Fleming M.D.   On: 07/17/2021 10:54   ECHOCARDIOGRAM COMPLETE  Result Date: 07/17/2021    ECHOCARDIOGRAM REPORT   Patient Name:   Christopher Romero Date of Exam: 07/17/2021 Medical Rec #:  [id number]     Height: Accession #:    [id number]    Weight: Date of Birth:  [birth year]    BSA: Patient Age:    60 years      BP:           103/80 mmHg Patient Gender: M             HR:           79 bpm. Exam Location:  Inpatient Procedure: 2D Echo, Color Doppler, Cardiac Doppler and Intracardiac            Opacification Agent Indications:    Afib  History:        Patient has no prior history of Echocardiogram examinations.                 Risk Factors:Hypertension and Dyslipidemia.  Sonographer:    Cleatis Polka Referring Phys: [id number] JENNIFER YATES IMPRESSIONS  1. No apical thrombus with Definity contrast. Left ventricular ejection fraction, by estimation, is 20 to 25%. Left ventricular ejection fraction by 2D MOD biplane is 24.2 %. The left ventricle has severely decreased function. The left ventricle demonstrates global hypokinesis. The left ventricular internal cavity size was severely dilated. Left ventricular diastolic function could not be evaluated.  2. Right ventricular systolic function is moderately reduced. The right ventricular size is mildly enlarged. There is normal pulmonary artery systolic pressure. The estimated right ventricular systolic pressure is 33.8 mmHg.  3. Left atrial size was mildly  dilated.  4. The mitral valve is grossly normal. Moderate to severe mitral valve regurgitation.  5. The aortic valve is tricuspid. Aortic valve regurgitation is not visualized.  6. The inferior vena cava is dilated in size with <50% respiratory variability, suggesting right atrial pressure of 15 mmHg. Comparison(s): No prior Echocardiogram. FINDINGS  Left Ventricle: No apical thrombus with Definity contrast. Left ventricular ejection fraction, by estimation, is 20 to 25%. Left ventricular ejection fraction by 2D MOD biplane is 24.2 %. The left ventricle has severely decreased function. The left ventricle demonstrates global hypokinesis. The left ventricular internal cavity size was severely dilated. There is no left ventricular hypertrophy. Left ventricular diastolic function could not be evaluated due to atrial fibrillation. Left ventricular diastolic function could not be evaluated. Right Ventricle: The  right ventricular size is mildly enlarged. No increase in right ventricular wall thickness. Right ventricular systolic function is moderately reduced. There is normal pulmonary artery systolic pressure. The tricuspid regurgitant velocity is 2.17 m/s, and with an assumed right atrial pressure of 15 mmHg, the estimated right ventricular systolic pressure is 33.8 mmHg. Left Atrium: Left atrial size was mildly dilated. Right Atrium: Right atrial size was normal in size. Pericardium: There is no evidence of pericardial effusion. Mitral Valve: The mitral valve is grossly normal. Moderate to severe mitral valve regurgitation, with posteriorly-directed jet. Tricuspid Valve: The tricuspid valve is grossly normal. Tricuspid valve regurgitation is mild. Aortic Valve: The aortic valve is tricuspid. Aortic valve regurgitation is not visualized. Aortic valve peak gradient measures 3.4 mmHg. Pulmonic Valve: The pulmonic valve was normal in structure. Pulmonic valve regurgitation is not visualized. Aorta: The aortic root and  ascending aorta are structurally normal, with no evidence of dilitation. Venous: The inferior vena cava is dilated in size with less than 50% respiratory variability, suggesting right atrial pressure of 15 mmHg. IAS/Shunts: No atrial level shunt detected by color flow Doppler.  LEFT VENTRICLE PLAX 2D                        Biplane EF (MOD) LVIDd:         6.70 cm         LV Biplane EF:   Left LVIDs:         5.50 cm                          ventricular LV PW:         1.10 cm                          ejection LV IVS:        1.00 cm                          fraction by LVOT diam:     2.20 cm                          2D MOD LV SV:         42                               biplane is LVOT Area:     3.80 cm                         24.2 %.                                 Diastology LV Volumes (MOD)               LV e' medial:    6.85 cm/s LV vol d, MOD    216.0 ml      LV E/e' medial:  14.9 A2C:                           LV e' lateral:   8.92 cm/s LV vol d, MOD    210.0 ml      LV E/e' lateral:  11.4 A4C: LV vol s, MOD    160.0 ml A2C: LV vol s, MOD    160.0 ml A4C: LV SV MOD A2C:   56.0 ml LV SV MOD A4C:   210.0 ml LV SV MOD BP:    52.1 ml RIGHT VENTRICLE            IVC RV Basal diam:  4.20 cm    IVC diam: 2.90 cm RV Mid diam:    2.90 cm RV S prime:     5.66 cm/s TAPSE (M-mode): 1.4 cm LEFT ATRIUM             RIGHT ATRIUM LA diam:        4.80 cm RA Area:     24.20 cm LA Vol (A2C):   62.8 ml RA Volume:   75.30 ml LA Vol (A4C):   76.3 ml LA Biplane Vol: 69.4 ml  AORTIC VALVE AV Area (Vmax): 2.71 cm AV Vmax:        92.60 cm/s AV Peak Grad:   3.4 mmHg LVOT Vmax:      65.90 cm/s LVOT Vmean:     49.900 cm/s LVOT VTI:       0.110 m  AORTA Ao Root diam: 3.60 cm Ao Asc diam:  3.10 cm MITRAL VALVE                TRICUSPID VALVE MV Area (PHT): 5.93 cm     TR Peak grad:   18.8 mmHg MV Decel Time: 128 msec     TR Vmax:        217.00 cm/s MR Peak grad: 57.0 mmHg MR Mean grad: 42.0 mmHg     SHUNTS MR Vmax:      377.50 cm/s   Systemic  VTI:  0.11 m MR Vmean:     314.0 cm/s    Systemic Diam: 2.20 cm MV E velocity: 102.00 cm/s Zoila Shutter MD Electronically signed by Zoila Shutter MD Signature Date/Time: 07/17/2021/3:16:56 PM    Final    [place] EKG SITE RITE  Result Date: 07/18/2021 If Site Rite image not attached, placement could not be confirmed due to current cardiac rhythm.    Scheduled Meds:  [MAR Hold] amiodarone  150 mg Intravenous Once   [MAR Hold] folic acid  1 mg Oral Daily   [MAR Hold] multivitamin with minerals  1 tablet Oral Daily   [MAR Hold] simvastatin  40 mg Oral Daily   [MAR Hold] sodium chloride flush  3 mL Intravenous Q12H   sodium chloride flush  3 mL Intravenous Q12H   [MAR Hold] sodium zirconium cyclosilicate  10 g Oral Once   [MAR Hold] thiamine  100 mg Oral Daily   Or   [MAR Hold] thiamine  100 mg Intravenous Daily   Continuous Infusions:  sodium chloride     sodium chloride 10 mL/hr at 07/18/21 1010   [MAR Hold] amiodarone     Followed by   Mitzi Hansen Hold] amiodarone     heparin     milrinone       LOS: 1 day    Time spent:  Zannie Cove, MD [facility]   07/18/2021, 11:09 AM

## 2021-07-19 DIAGNOSIS — I5021 Acute systolic (congestive) heart failure: Secondary | ICD-10-CM | POA: Insufficient documentation

## 2021-07-19 LAB — COMPREHENSIVE METABOLIC PANEL
ALT: 156 U/L — ABNORMAL HIGH (ref 0–44)
AST: 61 U/L — ABNORMAL HIGH (ref 15–41)
Albumin: 3.4 g/dL — ABNORMAL LOW (ref 3.5–5.0)
Alkaline Phosphatase: 54 U/L (ref 38–126)
Anion gap: 14 (ref 5–15)
BUN: 39 mg/dL — ABNORMAL HIGH (ref 8–23)
CO2: 33 mmol/L — ABNORMAL HIGH (ref 22–32)
Calcium: 9.3 mg/dL (ref 8.9–10.3)
Chloride: 89 mmol/L — ABNORMAL LOW (ref 98–111)
Creatinine, Ser: 1.7 mg/dL — ABNORMAL HIGH (ref 0.61–1.24)
GFR, Estimated: 45 mL/min — ABNORMAL LOW (ref >60)
Glucose, Bld: 107 mg/dL — ABNORMAL HIGH (ref 70–99)
Potassium: 3.5 mmol/L (ref 3.5–5.1)
Sodium: 136 mmol/L (ref 135–145)
Total Bilirubin: 0.5 mg/dL (ref 0.3–1.2)
Total Protein: 6.3 g/dL — ABNORMAL LOW (ref 6.5–8.1)

## 2021-07-19 LAB — COOXEMETRY PANEL
Carboxyhemoglobin: 2 % — ABNORMAL HIGH (ref 0.5–1.5)
Methemoglobin: <0.7 % (ref 0.0–1.5)
O2 Saturation: 66.7 %
Total hemoglobin: 16.2 g/dL — ABNORMAL HIGH (ref 12.0–16.0)

## 2021-07-19 LAB — APTT
aPTT: 41 seconds — ABNORMAL HIGH (ref 24–36)
aPTT: 66 seconds — ABNORMAL HIGH (ref 24–36)
aPTT: 82 seconds — ABNORMAL HIGH (ref 24–36)

## 2021-07-19 LAB — HEPARIN LEVEL (UNFRACTIONATED): Heparin Unfractionated: 0.75 IU/mL — ABNORMAL HIGH (ref 0.30–0.70)

## 2021-07-19 MED ORDER — DIGOXIN 125 MCG PO TABS
0.1250 mg | ORAL_TABLET | Freq: Every day | ORAL | Status: DC
  Administered 2021-07-19 – 2021-07-23 (×5): 0.125 mg via ORAL
  Filled 2021-07-19 (×5): qty 1

## 2021-07-19 MED ORDER — POTASSIUM CHLORIDE CRYS ER 20 MEQ PO TBCR
40.0000 meq | EXTENDED_RELEASE_TABLET | Freq: Two times a day (BID) | ORAL | Status: AC
  Administered 2021-07-19 (×2): 40 meq via ORAL
  Filled 2021-07-19 (×2): qty 2

## 2021-07-19 MED ORDER — CYCLOBENZAPRINE HCL 10 MG PO TABS
10.0000 mg | ORAL_TABLET | Freq: Three times a day (TID) | ORAL | Status: DC | PRN
  Administered 2021-07-19 – 2021-07-21 (×2): 10 mg via ORAL
  Filled 2021-07-19 (×3): qty 1

## 2021-07-19 MED ORDER — SPIRONOLACTONE 12.5 MG HALF TABLET
12.5000 mg | ORAL_TABLET | Freq: Every day | ORAL | Status: DC
Start: 2021-07-19 — End: 2021-07-22
  Administered 2021-07-19 – 2021-07-21 (×3): 12.5 mg via ORAL
  Filled 2021-07-19 (×4): qty 1

## 2021-07-19 MED ORDER — CYCLOBENZAPRINE HCL 5 MG PO TABS
5.0000 mg | ORAL_TABLET | Freq: Three times a day (TID) | ORAL | Status: DC | PRN
  Administered 2021-07-19 (×2): 5 mg via ORAL
  Filled 2021-07-19 (×2): qty 1

## 2021-07-19 NOTE — Plan of Care (Signed)
?  Problem: Education: ?Goal: Knowledge of disease or condition will improve ?Outcome: Progressing ?Goal: Understanding of medication regimen will improve ?Outcome: Progressing ?Goal: Individualized Educational Video(s) ?Outcome: Progressing ?  ?Problem: Activity: ?Goal: Ability to tolerate increased activity will improve ?Outcome: Progressing ?  ?Problem: Cardiac: ?Goal: Ability to achieve and maintain adequate cardiopulmonary perfusion will improve ?Outcome: Progressing ?  ?

## 2021-07-19 NOTE — Progress Notes (Addendum)
ANTICOAGULATION CONSULT NOTE ? ?Pharmacy Consult for Heparin ?Indication: atrial fibrillation ? ?No Known Allergies ? ?Patient Measurements: ?Height: 5\' 10"  (177.8 cm) ?Weight: 131.6 kg (290 lb 3.2 oz) ?IBW/kg (Calculated) : 73 ? ?Heparin Dosing Weight: 106 kg ? ?Vital Signs: ?Temp: 97.5 ?F (36.4 ?C) (03/04 1133) ?Temp Source: Oral (03/04 1133) ?BP: 137/67 (03/04 1133) ?Pulse Rate: 98 (03/04 1222) ? ?Labs: ?Recent Labs  ?  07/17/21 ?1028 07/17/21 ?1815 07/18/21 ?[id number] 07/18/21 ?1309 07/18/21 ?2000 07/19/21 ?0410 07/19/21 ?[id number] 07/19/21 ?1454  ?HGB 15.8  --  15.7  --   --   --   --   --   ?HCT 48.9  --  48.1  --   --   --   --   --   ?PLT 335  --  294  --   --   --   --   --   ?APTT  --   --   --   --  36 41*  --  66*  ?LABPROT 14.6  --   --   --   --   --   --   --   ?INR 1.1  --   --   --   --   --   --   --   ?HEPARINUNFRC  --   --   --   --  >1.10* 0.75*  --   --   ?CREATININE 0.98  --  2.03* 1.86*  --   --  1.70*  --   ?TROPONINIHS  --  74*  --  93*  --   --   --   --   ? ? ? ?Estimated Creatinine Clearance: 62.2 mL/min (A) (by C-G formula based on SCr of 1.7 mg/dL (H)). ? ? ?Assessment: ?14 YOM presenting with hypertension, new onset afib w/RVR on diltiazem gtt, he is not on anticoagulation PTA however was started on Xarelto in ED last dose 3/02 @1238 , now plans for [facility]. Pharmacy consulted to manage heparin. ? ?Heparin level still artificially elevated due to recent Xarelto dose. aPTT therapeutic at 66 but at the low end of the goal, so it is reasonable to empirically increase slightly to avoid dropping below goal. No signs/symptoms of bleeding or issues noted per chart review.  ? ?Goal of Therapy:  ?Heparin level 0.3-0.7 units/ml ?aPTT 66-102 seconds ?Monitor platelets by anticoagulation protocol: Yes ?  ?Plan:  ?Empirically icrease heparin infusion to 2050 units/hr ?aPTT in 6h ?aPTT and heparin level daily while on heparin until correlating ?Continue to monitor H&H and platelets ?Monitor for s/sx of  bleeding ? ?Thank you for including pharmacy in the care of this patient. ? ?Zenaida Deed, PharmD ?PGY1 Acute Care Pharmacy Resident  ?Phone: [phone] ?07/19/2021  3:26 PM ? ?Please check [email or web address] for unit-specific pharmacy phone numbers. ? ? ? ? ?

## 2021-07-19 NOTE — Progress Notes (Signed)
ANTICOAGULATION CONSULT NOTE ? ?Pharmacy Consult for Heparin ?Indication: atrial fibrillation ? ?No Known Allergies ? ?Patient Measurements: ?Height: 5\' 10"  (177.8 cm) ?Weight: 131.6 kg (290 lb 3.2 oz) ?IBW/kg (Calculated) : 73 ? ?Heparin Dosing Weight: 106 kg ? ?Vital Signs: ?Temp: 97.6 ?F (36.4 ?C) (03/04 0334) ?Temp Source: Oral (03/04 0334) ?BP: 114/94 (03/04 0334) ?Pulse Rate: 97 (03/04 0334) ? ?Labs: ?Recent Labs  ?  07/17/21 ?1028 07/17/21 ?1815 07/18/21 ?[id number] 07/18/21 ?1309 07/18/21 ?2000 07/19/21 ?0410 07/19/21 ?[id number]  ?HGB 15.8  --  15.7  --   --   --   --   ?HCT 48.9  --  48.1  --   --   --   --   ?PLT 335  --  294  --   --   --   --   ?APTT  --   --   --   --  36 41*  --   ?LABPROT 14.6  --   --   --   --   --   --   ?INR 1.1  --   --   --   --   --   --   ?HEPARINUNFRC  --   --   --   --  >1.10* 0.75*  --   ?CREATININE 0.98  --  2.03* 1.86*  --   --  1.70*  ?TROPONINIHS  --  74*  --  93*  --   --   --   ? ? ? ?Estimated Creatinine Clearance: 62.2 mL/min (A) (by C-G formula based on SCr of 1.7 mg/dL (H)). ? ? ?Assessment: ?5 YOM presenting with hypertension, new onset afib w/RVR on diltiazem gtt, he is not on anticoagulation PTA however was started on Xarelto in ED last dose 3/02 @1238 , now plans for [facility]. Pharmacy consulted to manage heparin. ? ?Heparin level still artificially elevated due to recent Xarelto dose. aPTT level still subtherapeutic with aPTT 41s. No signs/symptoms of bleeding. No reported issues with heparin infusion.  ? ?Goal of Therapy:  ?Heparin level 0.3-0.7 units/ml ?aPTT 66-102 seconds ?Monitor platelets by anticoagulation protocol: Yes ?  ?Plan:  ?Increase heparin infusion to 2000 units/hr ?Check heparin level in 6 hours and daily while on heparin ?Continue to monitor H&H and platelets ? ? ? ?Thank you for allowing pharmacy to be a part of this patient?s care. ? ?Erin Hearing PharmD., BCPS ?Clinical Pharmacist ?07/19/2021 5:53 AM ? ?

## 2021-07-19 NOTE — Progress Notes (Signed)
PROGRESS NOTE    Christopher Romero  [id number] DOB: 19-Sep-1960 DOA: 07/17/2021 PCP: Pcp, No  Narrative: 60/M with history of hypertension and dyslipidemia presented to the ED with palpitations, ran out of meds in January, kept feeling funny went to his PCP recently noted to have abnormal liver enzymes, continued to have increased heart rate and shortness of breath.  Presented to the ED noted to be in atrial flutter, with heart rate in the 130-1 40 range, also noted to have CHF. -Echo - EF of 20-25% -Cardiology following , 3/2 he was switched from IV Cardizem to p.o. metoprolol, subsequently dropped BP to 60-70s overnight, followed by bump in creatinine to 2.0 yesterday, metoprolol discontinued, started on amnio gtt. -Heart failure team consulted, now on Lasix and milrinone drip   Subjective: -Feels better overall, breathing is improving  Assessment and Plan:  Acute systolic CHF Moderate to severe MR low output state -Echo noted EF of 20-25%, suspect cardiomyopathy related to EtOH versus atrial flutter -Advanced heart failure team consulting, right heart cath yesterday noted significantly elevated right and left-sided filling pressures, low cardiac output -Started on Lasix and milrinone gtt. yesterday, brisk diuresis noted he is 10.5 L negative now -Kidney function improving -Anticipate need for left heart cath when volume status has improved -Monitor I's/O, BMP in a.m.  Atrial flutter with RVR -Heart rate in the 130-150 range on admission -BP dropped after metoprolol, then started amiodarone gtt. -Now in sinus rhythm, heart rate improved -Continue IV heparin  Acute kidney injury Hyperkalemia -Likely secondary to cardiorenal, low output state, and component of ATN from hypotension -Baseline creatinine is normal, improving on milrinone -Avoid hypotension, BP stable now  Abnormal LFTs -Likely secondary to passive hepatic congestion, low output, AST ALT pattern not consistent with  EtOH -Continue to trend  Obesity -BMI 44 -Needs weight loss, lifestyle modification  EtOH use -Counseled, drinks 2 beers every day and 6-10 on weekends -Continue thiamine, monitor for withdrawal  DVT prophylaxis: IV heparin Code Status: DNR Family Communication: No family at bedside Disposition Plan: Home pending cardiac work-up  Consultants:  Cardiology, heart failure  Procedures: 1. Elevated right and left heart filling pressures, biventricular failure.  2. Mild pulmonary venous hypertension.  3. Low cardiac output.     Antimicrobials:    Objective: Vitals:   07/19/21 0015 07/19/21 0334 07/19/21 0720 07/19/21 0818  BP: (!) 154/96 (!) 114/94 (!) 156/121 (!) 129/114  Pulse: 95 97 91 93  Resp: 13 16 (!) 22 18  Temp: 97.6 F (36.4 C) 97.6 F (36.4 C) 98.3 F (36.8 C)   TempSrc: Oral Oral Oral   SpO2: 98% 98% 93% 96%  Weight:  131.6 kg    Height:        Intake/Output Summary (Last 24 hours) at 07/19/2021 1107 Last data filed at 07/19/2021 1010 Gross per 24 hour  Intake 2541.95 ml  Output 14775 ml  Net -12233.05 ml   Filed Weights   07/17/21 1940 07/18/21 0500 07/19/21 0334  Weight: (!) 139.8 kg (!) 139.3 kg 131.6 kg    Examination:  General exam: Obese pleasant male's laying in bed, AAOx3, no distress HEENT: Positive JVD CVS: S1-S2, regular rhythm Lungs: Poor air movement bilaterally, otherwise clear Abdomen: Obese, soft, nontender, bowel sounds present Extremities: Trace edema Skin: No rashes Psychiatry: Judgement and insight appear normal. Mood & affect appropriate.     Data Reviewed:   CBC: Recent Labs  Lab 07/17/21 1028 07/18/21 0632  WBC 8.6 9.5  NEUTROABS 5.9  --  HGB 15.8 15.7  HCT 48.9 48.1  MCV 94.0 92.7  PLT 335 [id number]   Basic Metabolic Panel: Recent Labs  Lab 07/17/21 1028 07/18/21 0632 07/18/21 1309 07/19/21 0415  NA 133* 133* 130* 136  K 4.0 5.4* 4.3 3.5  CL 98 95* 95* 89*  CO2 25 30 21* 33*  GLUCOSE 118* 117* 122* 107*   BUN 28* 43* 44* 39*  CREATININE 0.98 2.03* 1.86* 1.70*  CALCIUM 8.7* 8.9 8.6* 9.3  MG  --   --  2.2  --    GFR: Estimated Creatinine Clearance: 62.2 mL/min (A) (by C-G formula based on SCr of 1.7 mg/dL (H)). Liver Function Tests: Recent Labs  Lab 07/17/21 1028 07/18/21 0632 07/19/21 0415  AST 71* 93* 61*  ALT 188* 191* 156*  ALKPHOS 47 56 54  BILITOT 0.7 1.2 0.5  PROT 6.0* 5.7* 6.3*  ALBUMIN 3.5 3.3* 3.4*   No results for input(s): LIPASE, AMYLASE in the last 168 hours. No results for input(s): AMMONIA in the last 168 hours. Coagulation Profile: Recent Labs  Lab 07/17/21 1028  INR 1.1   Cardiac Enzymes: No results for input(s): CKTOTAL, CKMB, CKMBINDEX, TROPONINI in the last 168 hours. BNP (last 3 results) No results for input(s): PROBNP in the last 8760 hours. HbA1C: Recent Labs    07/17/21 1815  HGBA1C 5.6   CBG: Recent Labs  Lab 07/17/21 2221  GLUCAP 173*   Lipid Profile: Recent Labs    07/17/21 1815  CHOL 152  HDL 18*  LDLCALC 115*  TRIG 96  CHOLHDL 8.4   Thyroid Function Tests: Recent Labs    07/17/21 1815  TSH 1.535   Anemia Panel: No results for input(s): VITAMINB12, FOLATE, FERRITIN, TIBC, IRON, RETICCTPCT in the last 72 hours. Urine analysis: No results found for: COLORURINE, APPEARANCEUR, LABSPEC, PHURINE, GLUCOSEU, HGBUR, BILIRUBINUR, KETONESUR, PROTEINUR, UROBILINOGEN, NITRITE, LEUKOCYTESUR Sepsis Labs: @LABRCNTIP (procalcitonin:4,lacticidven:4)  ) Recent Results (from the past 240 hour(s))  Resp Panel by RT-PCR (Flu A&B, Covid) Nasopharyngeal Swab     Status: None   Collection Time: 07/17/21 10:23 AM   Specimen: Nasopharyngeal Swab; Nasopharyngeal(NP) swabs in vial transport medium  Result Value Ref Range Status   SARS Coronavirus 2 by RT PCR NEGATIVE NEGATIVE Final    Comment: (NOTE) SARS-CoV-2 target nucleic acids are NOT DETECTED.  The SARS-CoV-2 RNA is generally detectable in upper respiratory specimens during the acute  phase of infection. The lowest concentration of SARS-CoV-2 viral copies this assay can detect is 138 copies/mL. A negative result does not preclude SARS-Cov-2 infection and should not be used as the sole basis for treatment or other patient management decisions. A negative result may occur with  improper specimen collection/handling, submission of specimen other than nasopharyngeal swab, presence of viral mutation(s) within the areas targeted by this assay, and inadequate number of viral copies(<138 copies/mL). A negative result must be combined with clinical observations, patient history, and epidemiological information. The expected result is Negative.  Fact Sheet for Patients:  EntrepreneurPulse.com.au  Fact Sheet for Healthcare Providers:  IncredibleEmployment.be  This test is no t yet approved or cleared by the [place] FDA and  has been authorized for detection and/or diagnosis of SARS-CoV-2 by FDA under an Emergency Use Authorization (EUA). This EUA will remain  in effect (meaning this test can be used) for the duration of the COVID-19 declaration under Section 564(b)(1) of the Act, 21 U.S.C.section 360bbb-3(b)(1), unless the authorization is terminated  or revoked sooner.       Influenza  A by PCR NEGATIVE NEGATIVE Final   Influenza B by PCR NEGATIVE NEGATIVE Final    Comment: (NOTE) The Xpert Xpress SARS-CoV-2/FLU/RSV plus assay is intended as an aid in the diagnosis of influenza from Nasopharyngeal swab specimens and should not be used as a sole basis for treatment. Nasal washings and aspirates are unacceptable for Xpert Xpress SARS-CoV-2/FLU/RSV testing.  Fact Sheet for Patients: EntrepreneurPulse.com.au  Fact Sheet for Healthcare Providers: IncredibleEmployment.be  This test is not yet approved or cleared by the [place] FDA and has been authorized for detection and/or diagnosis of  SARS-CoV-2 by FDA under an Emergency Use Authorization (EUA). This EUA will remain in effect (meaning this test can be used) for the duration of the COVID-19 declaration under Section 564(b)(1) of the Act, 21 U.S.C. section 360bbb-3(b)(1), unless the authorization is terminated or revoked.  Performed at [facility], [place] [address] [place], [place] [address]      Radiology Studies: CARDIAC CATHETERIZATION  Result Date: 07/18/2021 1. Elevated right and left heart filling pressures, biventricular failure. 2. Mild pulmonary venous hypertension. 3. Low cardiac output.   ECHOCARDIOGRAM COMPLETE  Result Date: 07/17/2021    ECHOCARDIOGRAM REPORT   Patient Name:   Christopher Romero Date of Exam: 07/17/2021 Medical Rec #:  [id number]     Height: Accession #:    [id number]    Weight: Date of Birth:  08-Aug-1960    BSA: Patient Age:    35 years      BP:           103/80 mmHg Patient Gender: M             HR:           79 bpm. Exam Location:  Inpatient Procedure: 2D Echo, Color Doppler, Cardiac Doppler and Intracardiac            Opacification Agent Indications:    Afib  History:        Patient has no prior history of Echocardiogram examinations.                 Risk Factors:Hypertension and Dyslipidemia.  Sonographer:    Jyl Heinz Referring Phys: [place]  1. No apical thrombus with Definity contrast. Left ventricular ejection fraction, by estimation, is 20 to 25%. Left ventricular ejection fraction by 2D MOD biplane is 24.2 %. The left ventricle has severely decreased function. The left ventricle demonstrates global hypokinesis. The left ventricular internal cavity size was severely dilated. Left ventricular diastolic function could not be evaluated.  2. Right ventricular systolic function is moderately reduced. The right ventricular size is mildly enlarged. There is normal pulmonary artery systolic pressure. The estimated right ventricular systolic pressure is 123XX123 mmHg.  3. Left  atrial size was mildly dilated.  4. The mitral valve is grossly normal. Moderate to severe mitral valve regurgitation.  5. The aortic valve is tricuspid. Aortic valve regurgitation is not visualized.  6. The inferior vena cava is dilated in size with <50% respiratory variability, suggesting right atrial pressure of 15 mmHg. Comparison(s): No prior Echocardiogram. FINDINGS  Left Ventricle: No apical thrombus with Definity contrast. Left ventricular ejection fraction, by estimation, is 20 to 25%. Left ventricular ejection fraction by 2D MOD biplane is 24.2 %. The left ventricle has severely decreased function. The left ventricle demonstrates global hypokinesis. The left ventricular internal cavity size was severely dilated. There is no left ventricular hypertrophy. Left ventricular diastolic function could not be evaluated due to atrial  fibrillation. Left ventricular diastolic function could not be evaluated. Right Ventricle: The right ventricular size is mildly enlarged. No increase in right ventricular wall thickness. Right ventricular systolic function is moderately reduced. There is normal pulmonary artery systolic pressure. The tricuspid regurgitant velocity is 2.17 m/s, and with an assumed right atrial pressure of 15 mmHg, the estimated right ventricular systolic pressure is 33.8 mmHg. Left Atrium: Left atrial size was mildly dilated. Right Atrium: Right atrial size was normal in size. Pericardium: There is no evidence of pericardial effusion. Mitral Valve: The mitral valve is grossly normal. Moderate to severe mitral valve regurgitation, with posteriorly-directed jet. Tricuspid Valve: The tricuspid valve is grossly normal. Tricuspid valve regurgitation is mild. Aortic Valve: The aortic valve is tricuspid. Aortic valve regurgitation is not visualized. Aortic valve peak gradient measures 3.4 mmHg. Pulmonic Valve: The pulmonic valve was normal in structure. Pulmonic valve regurgitation is not visualized. Aorta:  The aortic root and ascending aorta are structurally normal, with no evidence of dilitation. Venous: The inferior vena cava is dilated in size with less than 50% respiratory variability, suggesting right atrial pressure of 15 mmHg. IAS/Shunts: No atrial level shunt detected by color flow Doppler.  LEFT VENTRICLE PLAX 2D                        Biplane EF (MOD) LVIDd:         6.70 cm         LV Biplane EF:   Left LVIDs:         5.50 cm                          ventricular LV PW:         1.10 cm                          ejection LV IVS:        1.00 cm                          fraction by LVOT diam:     2.20 cm                          2D MOD LV SV:         42                               biplane is LVOT Area:     3.80 cm                         24.2 %.                                 Diastology LV Volumes (MOD)               LV e' medial:    6.85 cm/s LV vol d, MOD    216.0 ml      LV E/e' medial:  14.9 A2C:                           LV e' lateral:   8.92 cm/s LV vol d, MOD  210.0 ml      LV E/e' lateral: 11.4 A4C: LV vol s, MOD    160.0 ml A2C: LV vol s, MOD    160.0 ml A4C: LV SV MOD A2C:   56.0 ml LV SV MOD A4C:   210.0 ml LV SV MOD BP:    52.1 ml RIGHT VENTRICLE            IVC RV Basal diam:  4.20 cm    IVC diam: 2.90 cm RV Mid diam:    2.90 cm RV S prime:     5.66 cm/s TAPSE (M-mode): 1.4 cm LEFT ATRIUM             RIGHT ATRIUM LA diam:        4.80 cm RA Area:     24.20 cm LA Vol (A2C):   62.8 ml RA Volume:   75.30 ml LA Vol (A4C):   76.3 ml LA Biplane Vol: 69.4 ml  AORTIC VALVE AV Area (Vmax): 2.71 cm AV Vmax:        92.60 cm/s AV Peak Grad:   3.4 mmHg LVOT Vmax:      65.90 cm/s LVOT Vmean:     49.900 cm/s LVOT VTI:       0.110 m  AORTA Ao Root diam: 3.60 cm Ao Asc diam:  3.10 cm MITRAL VALVE                TRICUSPID VALVE MV Area (PHT): 5.93 cm     TR Peak grad:   18.8 mmHg MV Decel Time: 128 msec     TR Vmax:        217.00 cm/s MR Peak grad: 57.0 mmHg MR Mean grad: 42.0 mmHg     SHUNTS MR Vmax:       377.50 cm/s   Systemic VTI:  0.11 m MR Vmean:     314.0 cm/s    Systemic Diam: 2.20 cm MV E velocity: 102.00 cm/s Lyman Bishop MD Electronically signed by Lyman Bishop MD Signature Date/Time: 07/17/2021/3:16:56 PM    Final    [place] EKG SITE RITE  Result Date: 07/18/2021 If Site Rite image not attached, placement could not be confirmed due to current cardiac rhythm.  [place] EKG SITE RITE  Result Date: 07/18/2021 If Site Rite image not attached, placement could not be confirmed due to current cardiac rhythm.    Scheduled Meds:  amiodarone  150 mg Intravenous Once   Chlorhexidine Gluconate Cloth  6 each Topical Daily   folic acid  1 mg Oral Daily   multivitamin with minerals  1 tablet Oral Daily   potassium chloride  40 mEq Oral BID   rosuvastatin  20 mg Oral Daily   sodium chloride flush  10-40 mL Intracatheter Q12H   sodium chloride flush  3 mL Intravenous Q12H   sodium zirconium cyclosilicate  10 g Oral Once   thiamine  100 mg Oral Daily   Or   thiamine  100 mg Intravenous Daily   Continuous Infusions:  amiodarone 30 mg/hr (07/19/21 1009)   furosemide (LASIX) 200 mg in dextrose 5% 100 mL (2mg /mL) infusion 12 mg/hr (07/19/21 0403)   heparin 2,000 Units/hr (07/19/21 [id number])   milrinone 0.25 mcg/kg/min (07/19/21 0907)     LOS: 2 days    Time spent: 59min  Domenic Polite, MD [facility]   07/19/2021, 11:07 AM

## 2021-07-19 NOTE — Progress Notes (Signed)
Patient ID: Christopher Romero, male   DOB: Nov 13, 1960, 61 y.o.   MRN: [id number] ?  ? ? Advanced Heart Failure Rounding Note ? ?PCP-Cardiologist: Donato Schultz, MD  ? ?Subjective:   ? ?Feels much better today.   Markedly negative I/Os and weight down 17 lbs. CVP 10 this morning.  ? ?He is back in NSR this morning on amiodarone gtt.  ? ?Co-ox 67% on milrinone 0.25.  ? ?RHC Procedural Findings: ?Hemodynamics (mmHg) ?RA mean 21 ?RV 46/22 ?PA 47/39, mean 40 ?PCWP mean 30 ?Oxygen saturations: ?PA 62% ?AO 99% ?Cardiac Output (Fick) 4.21  ?Cardiac Index (Fick) 1.68 ?PVR 2.4 WU ? ?Objective:   ?Weight Range: ?131.6 kg ?Body mass index is 41.64 kg/m?.  ? ?Vital Signs:   ?Temp:  [97.5 ?F (36.4 ?C)-98.3 ?F (36.8 ?C)] 97.5 ?F (36.4 ?C) (03/04 1133) ?Pulse Rate:  [89-97] 89 (03/04 1133) ?Resp:  [13-22] 13 (03/04 1133) ?BP: (93-160)/(63-121) 137/67 (03/04 1133) ?SpO2:  [93 %-99 %] 96 % (03/04 1133) ?Weight:  [131.6 kg] 131.6 kg (03/04 0334) ?Last BM Date : 07/18/21 ? ?Weight change: ?Filed Weights  ? 07/17/21 1940 07/18/21 0500 07/19/21 0334  ?Weight: (!) 139.8 kg (!) 139.3 kg 131.6 kg  ? ? ?Intake/Output:  ? ?Intake/Output Summary (Last 24 hours) at 07/19/2021 1148 ?Last data filed at 07/19/2021 1126 ?Gross per 24 hour  ?Intake 2541.95 ml  ?Output 49449 ml  ?Net -12808.05 ml  ?  ? ? ?Physical Exam  ?  ?General:  Well appearing. No resp difficulty ?HEENT: Normal ?Neck: Supple. JVP 8-9. Carotids 2+ bilat; no bruits. No lymphadenopathy or thyromegaly appreciated. ?Cor: PMI nondisplaced. Regular rate & rhythm. No rubs, gallops or murmurs. ?Lungs: Clear ?Abdomen: Soft, nontender, nondistended. No hepatosplenomegaly. No bruits or masses. Good bowel sounds. ?Extremities: No cyanosis, clubbing, rash, edema ?Neuro: Alert & orientedx3, cranial nerves grossly intact. moves all 4 extremities w/o difficulty. Affect pleasant ? ? ?Telemetry  ? ?NSR 90s (personally reviewed) ? ?Labs  ?  ?CBC ?Recent Labs  ?  07/17/21 ?1028 07/18/21 ?6759  ?WBC 8.6 9.5   ?NEUTROABS 5.9  --   ?HGB 15.8 15.7  ?HCT 48.9 48.1  ?MCV 94.0 92.7  ?PLT 335 294  ? ?Basic Metabolic Panel ?Recent Labs  ?  07/18/21 ?1309 07/19/21 ?1638  ?NA 130* 136  ?K 4.3 3.5  ?CL 95* 89*  ?CO2 21* 33*  ?GLUCOSE 122* 107*  ?BUN 44* 39*  ?CREATININE 1.86* 1.70*  ?CALCIUM 8.6* 9.3  ?MG 2.2  --   ? ?Liver Function Tests ?Recent Labs  ?  07/18/21 ?4665 07/19/21 ?9935  ?AST 93* 61*  ?ALT 191* 156*  ?ALKPHOS 56 54  ?BILITOT 1.2 0.5  ?PROT 5.7* 6.3*  ?ALBUMIN 3.3* 3.4*  ? ?No results for input(s): LIPASE, AMYLASE in the last 72 hours. ?Cardiac Enzymes ?No results for input(s): CKTOTAL, CKMB, CKMBINDEX, TROPONINI in the last 72 hours. ? ?BNP: ?BNP (last 3 results) ?No results for input(s): BNP in the last 8760 hours. ? ?ProBNP (last 3 results) ?No results for input(s): PROBNP in the last 8760 hours. ? ? ?D-Dimer ?No results for input(s): DDIMER in the last 72 hours. ?Hemoglobin A1C ?Recent Labs  ?  07/17/21 ?1815  ?HGBA1C 5.6  ? ?Fasting Lipid Panel ?Recent Labs  ?  07/17/21 ?1815  ?CHOL 152  ?HDL 18*  ?LDLCALC 115*  ?TRIG 96  ?CHOLHDL 8.4  ? ?Thyroid Function Tests ?Recent Labs  ?  07/17/21 ?1815  ?TSH 1.535  ? ? ?Other results: ? ? ?Imaging  ? ? ?  No results found. ? ? ?Medications:   ? ? ?Scheduled Medications: ? amiodarone  150 mg Intravenous Once  ? Chlorhexidine Gluconate Cloth  6 each Topical Daily  ? digoxin  0.125 mg Oral Daily  ? folic acid  1 mg Oral Daily  ? multivitamin with minerals  1 tablet Oral Daily  ? potassium chloride  40 mEq Oral BID  ? rosuvastatin  20 mg Oral Daily  ? sodium chloride flush  10-40 mL Intracatheter Q12H  ? sodium chloride flush  3 mL Intravenous Q12H  ? sodium zirconium cyclosilicate  10 g Oral Once  ? spironolactone  12.5 mg Oral Daily  ? thiamine  100 mg Oral Daily  ? Or  ? thiamine  100 mg Intravenous Daily  ? ? ?Infusions: ? amiodarone 30 mg/hr (07/19/21 1009)  ? furosemide (LASIX) 200 mg in dextrose 5% 100 mL (2mg /mL) infusion 12 mg/hr (07/19/21 0403)  ? heparin 2,000  Units/hr (07/19/21 09/18/21)  ? milrinone 0.25 mcg/kg/min (07/19/21 09/18/21)  ? ? ?PRN Medications: ?acetaminophen **OR** acetaminophen, albuterol, cyclobenzaprine, LORazepam **OR** LORazepam, ondansetron **OR** ondansetron (ZOFRAN) IV, sodium chloride flush ? ? ? ?Assessment/Plan  ? ?1. Acute on chronic systolic CHF:  Patient has no prior history of cardiac disease. Admitted with several weeks of fatigue, palpitations.  Noted to be in atrial flutter with RVR.  Echo with EF 20-25%, global hypokinesis, severe LV dilation, mildly dilated and moderately dysfunctional RV, moderate-severe functional MR, dilated IVC.  He developed AKI with creatinine rising 0.98 => 2 without aggressive diuresis. RHC 3/3 showed severely elevated right and left heart filling pressures with low cardiac output.  Cause of cardiomyopathy uncertain.  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  However, need to rule out CAD (prior smoker, HTN).  Initial troponin mildly elevated.  Viral myocarditis would also be a consideration.  He is now on milrinone 0.25 with co-ox 67%. Excellent diuresis on Lasix gtt 12 mg/hr with CVP down to 10 and weight down 17 lbs.  He is back in NSR on amiodarone gtt.  ?- Continue milrinone 0.25 mcg/kg/min today, will likely start weaning tomorrow after full diuresis.  ?- Decrease Lasix gtt to 6 mg/hr with marked diuresis yesterday and significant fall in CVP.  Probably to po diuretic tomorrow.  ?- Add spironolactone 12.5 daily.  ?- Add digoxin 0.125 daily.  ?- Will need coronary angiography, but need stabilization of creatinine first (?Monday if creatinine trends down on milrinone).  ?- If no significant coronary disease on cath, will need cMRI.  ?2. Atrial flutter: With RVR.  May have been present several weeks prior to admission.  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 Now back in NSR on amiodarone gtt.  ?- Continue amiodarone gtt while on milrinone, then to po.  ?- Continue heparin gtt until after cath.  ?- Will need  outpatient EP evaluation to consider flutter ablation.  ?3. AKI: Suspect cardiorenal in setting of low output HF.  Creatinine improving with diuresis and milrinone.  1.7 today.  ?- Maintain cardiac output and MAP.  ?4. Mitral regurgitation: Moderate-severe, suspect functional.  ?- Reassess MR eventually when in NSR and after full diuresis.  ?  ?Length of Stay: 2 ? ?Sunday, MD  ?07/19/2021, 11:48 AM ? ?Advanced Heart Failure Team ?Pager [phone] (M-F; 7a - 5p)  ?Please contact [facility] for night-coverage after hours (5p -7a ) and weekends on [email or web address] ?  ?

## 2021-07-20 DIAGNOSIS — I5021 Acute systolic (congestive) heart failure: Secondary | ICD-10-CM

## 2021-07-20 LAB — CBC
HCT: 48.8 % (ref 39.0–52.0)
Hemoglobin: 16.6 g/dL (ref 13.0–17.0)
MCH: 30.4 pg (ref 26.0–34.0)
MCHC: 34 g/dL (ref 30.0–36.0)
MCV: 89.4 fL (ref 80.0–100.0)
Platelets: 253 10*3/uL (ref 150–400)
RBC: 5.46 MIL/uL (ref 4.22–5.81)
RDW: 13.6 % (ref 11.5–15.5)
WBC: 7.8 10*3/uL (ref 4.0–10.5)
nRBC: 0 % (ref 0.0–0.2)

## 2021-07-20 LAB — COMPREHENSIVE METABOLIC PANEL
ALT: 121 U/L — ABNORMAL HIGH (ref 0–44)
AST: 44 U/L — ABNORMAL HIGH (ref 15–41)
Albumin: 3.4 g/dL — ABNORMAL LOW (ref 3.5–5.0)
Alkaline Phosphatase: 55 U/L (ref 38–126)
Anion gap: 11 (ref 5–15)
BUN: 25 mg/dL — ABNORMAL HIGH (ref 8–23)
CO2: 36 mmol/L — ABNORMAL HIGH (ref 22–32)
Calcium: 8.8 mg/dL — ABNORMAL LOW (ref 8.9–10.3)
Chloride: 85 mmol/L — ABNORMAL LOW (ref 98–111)
Creatinine, Ser: 1.36 mg/dL — ABNORMAL HIGH (ref 0.61–1.24)
GFR, Estimated: 59 mL/min — ABNORMAL LOW (ref >60)
Glucose, Bld: 110 mg/dL — ABNORMAL HIGH (ref 70–99)
Potassium: 3.7 mmol/L (ref 3.5–5.1)
Sodium: 132 mmol/L — ABNORMAL LOW (ref 135–145)
Total Bilirubin: 0.6 mg/dL (ref 0.3–1.2)
Total Protein: 6.2 g/dL — ABNORMAL LOW (ref 6.5–8.1)

## 2021-07-20 LAB — COOXEMETRY PANEL
Carboxyhemoglobin: 2 % — ABNORMAL HIGH (ref 0.5–1.5)
Carboxyhemoglobin: 2 % — ABNORMAL HIGH (ref 0.5–1.5)
Carboxyhemoglobin: 1.5 % (ref 0.5–1.5)
Methemoglobin: <0.7 % (ref 0.0–1.5)
Methemoglobin: <0.7 % (ref 0.0–1.5)
Methemoglobin: 2.1 % — ABNORMAL HIGH (ref 0.0–1.5)
O2 Saturation: 78.8 %
O2 Saturation: 78.8 %
O2 Saturation: 65.8 %
Total hemoglobin: 16.8 g/dL — ABNORMAL HIGH (ref 12.0–16.0)
Total hemoglobin: 16.8 g/dL — ABNORMAL HIGH (ref 12.0–16.0)
Total hemoglobin: 17.3 g/dL — ABNORMAL HIGH (ref 12.0–16.0)

## 2021-07-20 LAB — APTT: aPTT: 77 seconds — ABNORMAL HIGH (ref 24–36)

## 2021-07-20 LAB — LACTIC ACID, PLASMA: Lactic Acid, Venous: 1.4 mmol/L (ref 0.5–1.9)

## 2021-07-20 LAB — HEPARIN LEVEL (UNFRACTIONATED): Heparin Unfractionated: 0.47 IU/mL (ref 0.30–0.70)

## 2021-07-20 MED ORDER — POTASSIUM CHLORIDE CRYS ER 20 MEQ PO TBCR: 40.0000 meq | EXTENDED_RELEASE_TABLET | Freq: Once | ORAL | Status: DC

## 2021-07-20 MED ORDER — SODIUM CHLORIDE 0.9% FLUSH
3.0000 mL | Freq: Two times a day (BID) | INTRAVENOUS | Status: DC
  Administered 2021-07-20 – 2021-07-22 (×3): 3 mL via INTRAVENOUS

## 2021-07-20 MED ORDER — ASPIRIN 81 MG PO CHEW
81.0000 mg | CHEWABLE_TABLET | ORAL | Status: AC
  Administered 2021-07-21: 81 mg via ORAL
  Filled 2021-07-20: qty 1

## 2021-07-20 MED ORDER — SODIUM CHLORIDE 0.9 % IV SOLN: INTRAVENOUS | Status: DC

## 2021-07-20 MED ORDER — POTASSIUM CHLORIDE CRYS ER 20 MEQ PO TBCR
40.0000 meq | EXTENDED_RELEASE_TABLET | Freq: Two times a day (BID) | ORAL | Status: AC
  Administered 2021-07-20 (×2): 40 meq via ORAL
  Filled 2021-07-20 (×2): qty 2

## 2021-07-20 MED ORDER — SACUBITRIL-VALSARTAN 24-26 MG PO TABS
1.0000 | ORAL_TABLET | Freq: Two times a day (BID) | ORAL | Status: DC
  Administered 2021-07-20 – 2021-07-21 (×2): 1 via ORAL
  Filled 2021-07-20 (×3): qty 1

## 2021-07-20 MED ORDER — SODIUM CHLORIDE 0.9 % IV SOLN: 250.0000 mL | INTRAVENOUS | Status: DC | PRN

## 2021-07-20 MED ORDER — SODIUM CHLORIDE 0.9% FLUSH: 3.0000 mL | INTRAVENOUS | Status: DC | PRN

## 2021-07-20 NOTE — Progress Notes (Signed)
ANTICOAGULATION CONSULT NOTE ? ?Pharmacy Consult for Heparin ?Indication: atrial fibrillation ? ?No Known Allergies ? ?Patient Measurements: ?Height: 5\' 10"  (177.8 cm) ?Weight: 131.6 kg (290 lb 3.2 oz) ?IBW/kg (Calculated) : 73 ? ?Heparin Dosing Weight: 106 kg ? ?Vital Signs: ?Temp: 97.5 ?F (36.4 ?C) (03/04 2358) ?Temp Source: Oral (03/04 2358) ?BP: 124/48 (03/04 2358) ?Pulse Rate: 90 (03/04 2358) ? ?Labs: ?Recent Labs  ?  07/17/21 ?1028 07/17/21 ?1815 07/18/21 ?[id number] 07/18/21 ?1309 07/18/21 ?2000 07/18/21 ?2000 07/19/21 ?0410 07/19/21 ?[id number] 07/19/21 ?1454 07/19/21 ?2221  ?HGB 15.8  --  15.7  --   --   --   --   --   --   --   ?HCT 48.9  --  48.1  --   --   --   --   --   --   --   ?PLT 335  --  294  --   --   --   --   --   --   --   ?APTT  --   --   --   --  36   < > 41*  --  66* 82*  ?LABPROT 14.6  --   --   --   --   --   --   --   --   --   ?INR 1.1  --   --   --   --   --   --   --   --   --   ?HEPARINUNFRC  --   --   --   --  >1.10*  --  0.75*  --   --   --   ?CREATININE 0.98  --  2.03* 1.86*  --   --   --  1.70*  --   --   ?TROPONINIHS  --  74*  --  93*  --   --   --   --   --   --   ? < > = values in this interval not displayed.  ? ? ? ?Estimated Creatinine Clearance: 62.2 mL/min (A) (by C-G formula based on SCr of 1.7 mg/dL (H)). ? ? ?Assessment: ?19 YOM presenting with hypertension, new onset afib w/RVR on diltiazem gtt, he is not on anticoagulation PTA however was started on Xarelto in ED last dose 3/02 @1238 , now plans for [facility]. Pharmacy consulted to manage heparin. ? ?aPTT therapeutic at 82s. No signs/symptoms of bleeding or issues noted per chart review.  ? ?Goal of Therapy:  ?Heparin level 0.3-0.7 units/ml ?aPTT 66-102 seconds ?Monitor platelets by anticoagulation protocol: Yes ?  ?Plan:  ?Continue heparin infusion at 2050 units/hr ?aPTT and heparin level daily while on heparin until correlating ?Continue to monitor H&H and platelets ?Monitor for s/sx of bleeding ? ?Thank you for including pharmacy in  the care of this patient. ? ?Erin Hearing PharmD., BCPS ?Clinical Pharmacist ?07/20/2021 1:19 AM ? ?

## 2021-07-20 NOTE — Progress Notes (Signed)
Patient ID: Christopher Romero, male   DOB: 02/07/1961, 60 y.o.   MRN: [id number] ?  ? ? Advanced Heart Failure Rounding Note ? ?PCP-Cardiologist: Mark Skains, MD  ? ?Subjective:   ? ?Breathing better.  I/Os net negative 4307, weight down 6 lbs.  CVP 10-11.  ? ?He remains in NSR this morning on amiodarone gtt.  ? ?Co-ox 79% on milrinone 0.25.  ? ?RHC Procedural Findings: ?Hemodynamics (mmHg) ?RA mean 21 ?RV 46/22 ?PA 47/39, mean 40 ?PCWP mean 30 ?Oxygen saturations: ?PA 62% ?AO 99% ?Cardiac Output (Fick) 4.21  ?Cardiac Index (Fick) 1.68 ?PVR 2.4 WU ? ?Objective:   ?Weight Range: ?128.9 kg ?Body mass index is 40.76 kg/m?.  ? ?Vital Signs:   ?Temp:  [97.5 ?F (36.4 ?C)-98.1 ?F (36.7 ?C)] 98 ?F (36.7 ?C) (03/05 1111) ?Pulse Rate:  [88-98] 93 (03/05 1111) ?Resp:  [13-16] 16 (03/05 1111) ?BP: (113-155)/(48-83) 137/55 (03/05 1111) ?SpO2:  [93 %-99 %] 99 % (03/05 1111) ?Weight:  [128.9 kg] 128.9 kg (03/05 0450) ?Last BM Date : 07/18/21 ? ?Weight change: ?Filed Weights  ? 07/18/21 0500 07/19/21 0334 07/20/21 0450  ?Weight: (!) 139.3 kg 131.6 kg 128.9 kg  ? ? ?Intake/Output:  ? ?Intake/Output Summary (Last 24 hours) at 07/20/2021 1123 ?Last data filed at 07/20/2021 1000 ?Gross per 24 hour  ?Intake 2218.07 ml  ?Output 5200 ml  ?Net -2981.93 ml  ?  ? ? ?Physical Exam  ?  ?General: NAD ?Neck: JVP 10 cm, no thyromegaly or thyroid nodule.  ?Lungs: Clear to auscultation bilaterally with normal respiratory effort. ?CV: Nondisplaced PMI.  Heart regular S1/S2, no S3/S4, no murmur.  No peripheral edema.   ?Abdomen: Soft, nontender, no hepatosplenomegaly, no distention.  ?Skin: Intact without lesions or rashes.  ?Neurologic: Alert and oriented x 3.  ?Psych: Normal affect. ?Extremities: No clubbing or cyanosis.  ?HEENT: Normal.  ? ?Telemetry  ? ?NSR 90s (personally reviewed) ? ?Labs  ?  ?CBC ?Recent Labs  ?  07/18/21 ?0632 07/20/21 ?0428  ?WBC 9.5 7.8  ?HGB 15.7 16.6  ?HCT 48.1 48.8  ?MCV 92.7 89.4  ?PLT 294 253  ? ?Basic Metabolic Panel ?Recent  Labs  ?  07/18/21 ?1309 07/19/21 ?0415 07/20/21 ?0428  ?NA 130* 136 132*  ?K 4.3 3.5 3.7  ?CL 95* 89* 85*  ?CO2 21* 33* 36*  ?GLUCOSE 122* 107* 110*  ?BUN 44* 39* 25*  ?CREATININE 1.86* 1.70* 1.36*  ?CALCIUM 8.6* 9.3 8.8*  ?MG 2.2  --   --   ? ?Liver Function Tests ?Recent Labs  ?  07/19/21 ?0415 07/20/21 ?0428  ?AST 61* 44*  ?ALT 156* 121*  ?ALKPHOS 54 55  ?BILITOT 0.5 0.6  ?PROT 6.3* 6.2*  ?ALBUMIN 3.4* 3.4*  ? ?No results for input(s): LIPASE, AMYLASE in the last 72 hours. ?Cardiac Enzymes ?No results for input(s): CKTOTAL, CKMB, CKMBINDEX, TROPONINI in the last 72 hours. ? ?BNP: ?BNP (last 3 results) ?No results for input(s): BNP in the last 8760 hours. ? ?ProBNP (last 3 results) ?No results for input(s): PROBNP in the last 8760 hours. ? ? ?D-Dimer ?No results for input(s): DDIMER in the last 72 hours. ?Hemoglobin A1C ?Recent Labs  ?  07/17/21 ?1815  ?HGBA1C 5.6  ? ?Fasting Lipid Panel ?Recent Labs  ?  07/17/21 ?1815  ?CHOL 152  ?HDL 18*  ?LDLCALC 115*  ?TRIG 96  ?CHOLHDL 8.4  ? ?Thyroid Function Tests ?Recent Labs  ?  07/17/21 ?1815  ?TSH 1.535  ? ? ?Other results: ? ? ?Imaging  ? ? ?  No results found. ? ? ?Medications:   ? ? ?Scheduled Medications: ? amiodarone  150 mg Intravenous Once  ? Chlorhexidine Gluconate Cloth  6 each Topical Daily  ? digoxin  0.125 mg Oral Daily  ? folic acid  1 mg Oral Daily  ? multivitamin with minerals  1 tablet Oral Daily  ? potassium chloride  40 mEq Oral BID  ? rosuvastatin  20 mg Oral Daily  ? sacubitril-valsartan  1 tablet Oral BID  ? sodium chloride flush  10-40 mL Intracatheter Q12H  ? sodium chloride flush  3 mL Intravenous Q12H  ? sodium zirconium cyclosilicate  10 g Oral Once  ? spironolactone  12.5 mg Oral Daily  ? thiamine  100 mg Oral Daily  ? Or  ? thiamine  100 mg Intravenous Daily  ? ? ?Infusions: ? amiodarone 30 mg/hr (07/20/21 1058)  ? furosemide (LASIX) 200 mg in dextrose 5% 100 mL (2mg/mL) infusion 6 mg/hr (07/20/21 0159)  ? heparin 2,050 Units/hr (07/20/21 0437)   ? milrinone 0.25 mcg/kg/min (07/20/21 0159)  ? ? ?PRN Medications: ?acetaminophen **OR** acetaminophen, albuterol, cyclobenzaprine, LORazepam **OR** LORazepam, ondansetron **OR** ondansetron (ZOFRAN) IV, sodium chloride flush ? ? ? ?Assessment/Plan  ? ?1. Acute on chronic systolic CHF:  Patient has no prior history of cardiac disease. Admitted with several weeks of fatigue, palpitations.  Noted to be in atrial flutter with RVR.  Echo with EF 20-25%, global hypokinesis, severe LV dilation, mildly dilated and moderately dysfunctional RV, moderate-severe functional MR, dilated IVC.  He developed AKI with creatinine rising 0.98 => 2 without aggressive diuresis. RHC 3/3 showed severely elevated right and left heart filling pressures with low cardiac output.  Cause of cardiomyopathy uncertain.  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  However, need to rule out CAD (prior smoker, HTN).  Initial troponin mildly elevated.  Viral myocarditis would also be a consideration.  He is now on milrinone 0.25 with co-ox 79%. Excellent diuresis on Lasix gtt 6 mg/hr with CVP down to 10-11 and weight down another 6 lbs.  He is back in NSR on amiodarone gtt.  ?- Continue milrinone 0.25 mcg/kg/min today, will likely start weaning tomorrow after full diuresis.  ?- Run Lasix today at 8 mg/hr, need 1 more day of aggressive diuresis.  Probably to po diuretic tomorrow.  ?- Continue spironolactone 12.5 daily.  ?- Continue digoxin 0.125 daily.  ?- Add Entresto 24/26 bid.  ?- Coronary angiography Monday, discussed risks/benefits with patient and he agrees to procedure.   ?- If no significant coronary disease on cath, will need cMRI.  ?2. Atrial flutter: With RVR.  May have been present several weeks prior to admission.  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 Now back in NSR on amiodarone gtt.  ?- Continue amiodarone gtt while on milrinone, then to po.  ?- Continue heparin gtt until after cath.  ?- Will need outpatient EP evaluation to  consider flutter ablation.  ?3. AKI: Suspect cardiorenal in setting of low output HF.  Creatinine improving with diuresis and milrinone.  1.36 today.  ?- Maintain cardiac output and MAP.  ?4. Mitral regurgitation: Moderate-severe, suspect functional.  ?- Reassess MR eventually when in NSR and after full diuresis.  ?  ?Length of Stay: 3 ? ?Circe Chilton, MD  ?07/20/2021, 11:23 AM ? ?Advanced Heart Failure Team ?Pager [phone] (M-F; 7a - 5p)  ?Please contact [facility] for night-coverage after hours (5p -7a ) and weekends on [email or web address] ?  ?

## 2021-07-20 NOTE — Plan of Care (Signed)
  Problem: Education: Goal: Knowledge of disease or condition will improve Outcome: Progressing   Problem: Activity: Goal: Ability to tolerate increased activity will improve Outcome: Progressing   Problem: Cardiac: Goal: Ability to achieve and maintain adequate cardiopulmonary perfusion will improve Outcome: Progressing   

## 2021-07-20 NOTE — H&P (View-Only) (Signed)
Patient ID: Christopher Romero, male   DOB: Mar 09, 1961, 61 y.o.   MRN: [id number] ?  ? ? Advanced Heart Failure Rounding Note ? ?PCP-Cardiologist: Candee Furbish, MD  ? ?Subjective:   ? ?Breathing better.  I/Os net negative 4307, weight down 6 lbs.  CVP 10-11.  ? ?He remains in NSR this morning on amiodarone gtt.  ? ?Co-ox 79% on milrinone 0.25.  ? ?RHC Procedural Findings: ?Hemodynamics (mmHg) ?RA mean 21 ?RV 46/22 ?PA 47/39, mean 40 ?PCWP mean 30 ?Oxygen saturations: ?PA 62% ?AO 99% ?Cardiac Output (Fick) 4.21  ?Cardiac Index (Fick) 1.68 ?PVR 2.4 WU ? ?Objective:   ?Weight Range: ?128.9 kg ?Body mass index is 40.76 kg/m?.  ? ?Vital Signs:   ?Temp:  [97.5 ?F (36.4 ?C)-98.1 ?F (36.7 ?C)] 98 ?F (36.7 ?C) (03/05 1111) ?Pulse Rate:  [88-98] 93 (03/05 1111) ?Resp:  [13-16] 16 (03/05 1111) ?BP: (113-155)/(48-83) 137/55 (03/05 1111) ?SpO2:  [93 %-99 %] 99 % (03/05 1111) ?Weight:  [128.9 kg] 128.9 kg (03/05 0450) ?Last BM Date : 07/18/21 ? ?Weight change: ?Filed Weights  ? 07/18/21 0500 07/19/21 0334 07/20/21 0450  ?Weight: (!) 139.3 kg 131.6 kg 128.9 kg  ? ? ?Intake/Output:  ? ?Intake/Output Summary (Last 24 hours) at 07/20/2021 1123 ?Last data filed at 07/20/2021 1000 ?Gross per 24 hour  ?Intake 2218.07 ml  ?Output 5200 ml  ?Net -2981.93 ml  ?  ? ? ?Physical Exam  ?  ?General: NAD ?Neck: JVP 10 cm, no thyromegaly or thyroid nodule.  ?Lungs: Clear to auscultation bilaterally with normal respiratory effort. ?CV: Nondisplaced PMI.  Heart regular S1/S2, no S3/S4, no murmur.  No peripheral edema.   ?Abdomen: Soft, nontender, no hepatosplenomegaly, no distention.  ?Skin: Intact without lesions or rashes.  ?Neurologic: Alert and oriented x 3.  ?Psych: Normal affect. ?Extremities: No clubbing or cyanosis.  ?HEENT: Normal.  ? ?Telemetry  ? ?NSR 90s (personally reviewed) ? ?Labs  ?  ?CBC ?Recent Labs  ?  07/18/21 ?[id number] 07/20/21 ?0428  ?WBC 9.5 7.8  ?HGB 15.7 16.6  ?HCT 48.1 48.8  ?MCV 92.7 89.4  ?PLT 294 253  ? ?Basic Metabolic Panel ?Recent  Labs  ?  07/18/21 ?1309 07/19/21 ?[id number] 07/20/21 ?0428  ?NA 130* 136 132*  ?K 4.3 3.5 3.7  ?CL 95* 89* 85*  ?CO2 21* 33* 36*  ?GLUCOSE 122* 107* 110*  ?BUN 44* 39* 25*  ?CREATININE 1.86* 1.70* 1.36*  ?CALCIUM 8.6* 9.3 8.8*  ?MG 2.2  --   --   ? ?Liver Function Tests ?Recent Labs  ?  07/19/21 ?[id number] 07/20/21 ?0428  ?AST 61* 44*  ?ALT 156* 121*  ?ALKPHOS 54 55  ?BILITOT 0.5 0.6  ?PROT 6.3* 6.2*  ?ALBUMIN 3.4* 3.4*  ? ?No results for input(s): LIPASE, AMYLASE in the last 72 hours. ?Cardiac Enzymes ?No results for input(s): CKTOTAL, CKMB, CKMBINDEX, TROPONINI in the last 72 hours. ? ?BNP: ?BNP (last 3 results) ?No results for input(s): BNP in the last 8760 hours. ? ?ProBNP (last 3 results) ?No results for input(s): PROBNP in the last 8760 hours. ? ? ?D-Dimer ?No results for input(s): DDIMER in the last 72 hours. ?Hemoglobin A1C ?Recent Labs  ?  07/17/21 ?1815  ?HGBA1C 5.6  ? ?Fasting Lipid Panel ?Recent Labs  ?  07/17/21 ?1815  ?CHOL 152  ?HDL 18*  ?LDLCALC 115*  ?TRIG 96  ?CHOLHDL 8.4  ? ?Thyroid Function Tests ?Recent Labs  ?  07/17/21 ?1815  ?TSH 1.535  ? ? ?Other results: ? ? ?Imaging  ? ? ?  No results found. ? ? ?Medications:   ? ? ?Scheduled Medications: ? amiodarone  150 mg Intravenous Once  ? Chlorhexidine Gluconate Cloth  6 each Topical Daily  ? digoxin  0.125 mg Oral Daily  ? folic acid  1 mg Oral Daily  ? multivitamin with minerals  1 tablet Oral Daily  ? potassium chloride  40 mEq Oral BID  ? rosuvastatin  20 mg Oral Daily  ? sacubitril-valsartan  1 tablet Oral BID  ? sodium chloride flush  10-40 mL Intracatheter Q12H  ? sodium chloride flush  3 mL Intravenous Q12H  ? sodium zirconium cyclosilicate  10 g Oral Once  ? spironolactone  12.5 mg Oral Daily  ? thiamine  100 mg Oral Daily  ? Or  ? thiamine  100 mg Intravenous Daily  ? ? ?Infusions: ? amiodarone 30 mg/hr (07/20/21 1058)  ? furosemide (LASIX) 200 mg in dextrose 5% 100 mL (2mg /mL) infusion 6 mg/hr (07/20/21 0159)  ? heparin 2,050 Units/hr (07/20/21 0437)   ? milrinone 0.25 mcg/kg/min (07/20/21 0159)  ? ? ?PRN Medications: ?acetaminophen **OR** acetaminophen, albuterol, cyclobenzaprine, LORazepam **OR** LORazepam, ondansetron **OR** ondansetron (ZOFRAN) IV, sodium chloride flush ? ? ? ?Assessment/Plan  ? ?1. Acute on chronic systolic CHF:  Patient has no prior history of cardiac disease. Admitted with several weeks of fatigue, palpitations.  Noted to be in atrial flutter with RVR.  Echo with EF 20-25%, global hypokinesis, severe LV dilation, mildly dilated and moderately dysfunctional RV, moderate-severe functional MR, dilated IVC.  He developed AKI with creatinine rising 0.98 => 2 without aggressive diuresis. RHC 3/3 showed severely elevated right and left heart filling pressures with low cardiac output.  Cause of cardiomyopathy uncertain.  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  However, need to rule out CAD (prior smoker, HTN).  Initial troponin mildly elevated.  Viral myocarditis would also be a consideration.  He is now on milrinone 0.25 with co-ox 79%. Excellent diuresis on Lasix gtt 6 mg/hr with CVP down to 10-11 and weight down another 6 lbs.  He is back in NSR on amiodarone gtt.  ?- Continue milrinone 0.25 mcg/kg/min today, will likely start weaning tomorrow after full diuresis.  ?- Run Lasix today at 8 mg/hr, need 1 more day of aggressive diuresis.  Probably to po diuretic tomorrow.  ?- Continue spironolactone 12.5 daily.  ?- Continue digoxin 0.125 daily.  ?- Add Entresto 24/26 bid.  ?- Coronary angiography Monday, discussed risks/benefits with patient and he agrees to procedure.   ?- If no significant coronary disease on cath, will need cMRI.  ?2. Atrial flutter: With RVR.  May have been present several weeks prior to admission.  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 Now back in NSR on amiodarone gtt.  ?- Continue amiodarone gtt while on milrinone, then to po.  ?- Continue heparin gtt until after cath.  ?- Will need outpatient EP evaluation to  consider flutter ablation.  ?3. AKI: Suspect cardiorenal in setting of low output HF.  Creatinine improving with diuresis and milrinone.  1.36 today.  ?- Maintain cardiac output and MAP.  ?4. Mitral regurgitation: Moderate-severe, suspect functional.  ?- Reassess MR eventually when in NSR and after full diuresis.  ?  ?Length of Stay: 3 ? ?Loralie Champagne, MD  ?07/20/2021, 11:23 AM ? ?Advanced Heart Failure Team ?Pager [phone] (M-F; 7a - 5p)  ?Please contact [facility] for night-coverage after hours (5p -7a ) and weekends on [email or web address] ?  ?

## 2021-07-20 NOTE — Progress Notes (Signed)
ANTICOAGULATION CONSULT NOTE ? ?Pharmacy Consult for Heparin ?Indication: atrial fibrillation ? ?No Known Allergies ? ?Patient Measurements: ?Height: 5\' 10"  (177.8 cm) ?Weight: 128.9 kg (284 lb 1.6 oz) ?IBW/kg (Calculated) : 73 ? ?Heparin Dosing Weight: 106 kg ? ?Vital Signs: ?Temp: 97.7 ?F (36.5 ?C) (03/05 0732) ?Temp Source: Oral (03/05 0732) ?BP: 153/69 (03/05 0732) ?Pulse Rate: 88 (03/05 0732) ? ?Labs: ?Recent Labs  ?  07/17/21 ?1028 07/17/21 ?1815 07/18/21 ?[id number] 07/18/21 ?1309 07/18/21 ?2000 07/18/21 ?2000 07/19/21 ?0410 07/19/21 ?[id number] 07/19/21 ?1454 07/19/21 ?2221 07/20/21 ?[id number] 07/20/21 ?[id number]  ?HGB 15.8  --  15.7  --   --   --   --   --   --   --  16.6  --   ?HCT 48.9  --  48.1  --   --   --   --   --   --   --  48.8  --   ?PLT 335  --  294  --   --   --   --   --   --   --  253  --   ?APTT  --   --   --   --  36   < > 41*  --  66* 82*  --  77*  ?LABPROT 14.6  --   --   --   --   --   --   --   --   --   --   --   ?INR 1.1  --   --   --   --   --   --   --   --   --   --   --   ?HEPARINUNFRC  --   --   --   --  >1.10*  --  0.75*  --   --   --   --  0.47  ?CREATININE 0.98  --  2.03* 1.86*  --   --   --  1.70*  --   --  1.36*  --   ?TROPONINIHS  --  74*  --  93*  --   --   --   --   --   --   --   --   ? < > = values in this interval not displayed.  ? ? ? ?Estimated Creatinine Clearance: 77 mL/min (A) (by C-G formula based on SCr of 1.36 mg/dL (H)). ? ? ?Assessment: ?77 YOM presenting with hypertension, new onset afib w/RVR on diltiazem gtt, he is not on anticoagulation PTA however was started on Xarelto in ED last dose 3/02 @1238 , now plans for [facility]. Pharmacy consulted to manage heparin. ? ?aPTT continues to be therapeutic this morning at 77. Heparin level is now therapeutic at 0.47 and appears to be correlating with aPTT, so will continue monitoring only heparin level now. No signs/symptoms of bleeding or issues noted per chart review.  ? ?Goal of Therapy:  ?Heparin level 0.3-0.7 units/ml ?aPTT 66-102  seconds ?Monitor platelets by anticoagulation protocol: Yes ?  ?Plan:  ?Continue heparin 2050 units/hr  ?Heparin level daily ?Continue to monitor H&H and platelets ?Monitor for s/sx of bleeding ? ?Thank you for including pharmacy in the care of this patient. ? ?Zenaida Deed, PharmD ?PGY1 Acute Care Pharmacy Resident  ?Phone: [phone] ?07/20/2021  8:36 AM ? ?Please check [email or web address] for unit-specific pharmacy phone numbers. ? ?

## 2021-07-20 NOTE — Progress Notes (Signed)
Dr Lendell Caprice with cardiology paged due to pt's SBP in the 80's. Pt is asymptomatic. CVP reading 3-4 currently. Orders received to decrease lasix gtt to 6mg /hr, draw lactic and co ox and recheck BP in 1-1.5 hrs. ?

## 2021-07-20 NOTE — Progress Notes (Signed)
PROGRESS NOTE    Christopher Romero  XNA:[id number] DOB: 06/22/1960 DOA: 07/17/2021 PCP: Pcp, No  Narrative: 60/M with history of hypertension and dyslipidemia presented to the ED with palpitations, ran out of meds in January, kept feeling funny went to his PCP recently noted to have abnormal liver enzymes, continued to have increased heart rate and shortness of breath.  Presented to the ED noted to be in atrial flutter, with heart rate in the 130-140 range, also noted to have CHF. -Echo - EF of 20-25% -Cardiology following , 3/2 he was switched from IV Cardizem to p.o. metoprolol, subsequently dropped BP to 60-70s overnight, followed by bump in creatinine to 2.0, metoprolol discontinued, started on amnio gtt. -Heart failure team consulted, now on Lasix and milrinone drip -Diuresing well, creatinine improving  Subjective: -Feels a whole lot better, has not rested/slept this well in a long time, cramps improving  Assessment and Plan:  Acute systolic CHF Moderate to severe MR low output state -Echo noted EF of 20-25%, suspect cardiomyopathy -tachycardia mediated versus EtOH -Advanced heart failure team consulting, right heart cath 3/3 noted significantly elevated right and left-sided filling pressures, low cardiac output -Started on Lasix and milrinone gtt., significant diuresis noted, he is 14.2 L negative  -Started on Aldactone, digoxin -CHF team following, creatinine improving  -For left heart cath in 1 to 2 days -Monitor I's/O, BMP in a.m.  Atrial flutter with RVR -Heart rate in the 130-150 range on admission -BP dropped after metoprolol, then started amiodarone gtt. -Now in sinus rhythm, heart rate improved -Continue IV heparin  Acute kidney injury Hyperkalemia -Likely secondary to cardiorenal, low output state, and component of ATN from hypotension -Baseline creatinine is normal, improving on milrinone -Avoid hypotension, BP stable now  Abnormal LFTs -Likely secondary to  passive hepatic congestion, low output, AST ALT pattern not consistent with EtOH -Improving, trend  Obesity -BMI 44 -Needs weight loss, lifestyle modification  EtOH use -Counseled, drinks 2 beers every day and 6-10 on weekends -Continue thiamine, no withdrawal noted  DVT prophylaxis: IV heparin Code Status: DNR Family Communication: No family at bedside Disposition Plan: Home pending cardiac work-up  Consultants:  Cardiology, heart failure  Procedures: 1. Elevated right and left heart filling pressures, biventricular failure.  2. Mild pulmonary venous hypertension.  3. Low cardiac output.     Antimicrobials:    Objective: Vitals:   07/19/21 2358 07/20/21 0450 07/20/21 0732 07/20/21 1111  BP: (!) 124/48 (!) 155/83 (!) 153/69 (!) 137/55  Pulse: 90 91 88 93  Resp: 13 14 14 16   Temp: (!) 97.5 F (36.4 C) 98.1 F (36.7 C) 97.7 F (36.5 C) 98 F (36.7 C)  TempSrc: Oral Oral Oral Oral  SpO2: 97% 93% 98% 99%  Weight:  128.9 kg    Height:        Intake/Output Summary (Last 24 hours) at 07/20/2021 1128 Last data filed at 07/20/2021 1000 Gross per 24 hour  Intake 2218.07 ml  Output 4625 ml  Net -2406.93 ml   Filed Weights   07/18/21 0500 07/19/21 0334 07/20/21 0450  Weight: (!) 139.3 kg 131.6 kg 128.9 kg    Examination:  General exam: Obese pleasant male laying in bed, AAOx3, no distress HEENT: Positive JVD CVS: S1-S2, regular rhythm Lungs: Poor air movement otherwise clear Abdomen: Soft, nontender, bowel sounds present Extremities: No edema Skin: No rashes Psychiatry: Judgement and insight appear normal. Mood & affect appropriate.     Data Reviewed:   CBC: Recent Labs  Lab  07/17/21 1028 07/18/21 0632 07/20/21 0428  WBC 8.6 9.5 7.8  NEUTROABS 5.9  --   --   HGB 15.8 15.7 16.6  HCT 48.9 48.1 48.8  MCV 94.0 92.7 89.4  PLT 335 294 253   Basic Metabolic Panel: Recent Labs  Lab 07/17/21 1028 07/18/21 0632 07/18/21 1309 07/19/21 0415 07/20/21 0428   NA 133* 133* 130* 136 132*  K 4.0 5.4* 4.3 3.5 3.7  CL 98 95* 95* 89* 85*  CO2 25 30 21* 33* 36*  GLUCOSE 118* 117* 122* 107* 110*  BUN 28* 43* 44* 39* 25*  CREATININE 0.98 2.03* 1.86* 1.70* 1.36*  CALCIUM 8.7* 8.9 8.6* 9.3 8.8*  MG  --   --  2.2  --   --    GFR: Estimated Creatinine Clearance: 77 mL/min (A) (by C-G formula based on SCr of 1.36 mg/dL (H)). Liver Function Tests: Recent Labs  Lab 07/17/21 1028 07/18/21 0632 07/19/21 0415 07/20/21 0428  AST 71* 93* 61* 44*  ALT 188* 191* 156* 121*  ALKPHOS 47 56 54 55  BILITOT 0.7 1.2 0.5 0.6  PROT 6.0* 5.7* 6.3* 6.2*  ALBUMIN 3.5 3.3* 3.4* 3.4*   No results for input(s): LIPASE, AMYLASE in the last 168 hours. No results for input(s): AMMONIA in the last 168 hours. Coagulation Profile: Recent Labs  Lab 07/17/21 1028  INR 1.1   Cardiac Enzymes: No results for input(s): CKTOTAL, CKMB, CKMBINDEX, TROPONINI in the last 168 hours. BNP (last 3 results) No results for input(s): PROBNP in the last 8760 hours. HbA1C: Recent Labs    07/17/21 1815  HGBA1C 5.6   CBG: Recent Labs  Lab 07/17/21 2221  GLUCAP 173*   Lipid Profile: Recent Labs    07/17/21 1815  CHOL 152  HDL 18*  LDLCALC 115*  TRIG 96  CHOLHDL 8.4   Thyroid Function Tests: Recent Labs    07/17/21 1815  TSH 1.535   Anemia Panel: No results for input(s): VITAMINB12, FOLATE, FERRITIN, TIBC, IRON, RETICCTPCT in the last 72 hours. Urine analysis: No results found for: COLORURINE, APPEARANCEUR, LABSPEC, PHURINE, GLUCOSEU, HGBUR, BILIRUBINUR, KETONESUR, PROTEINUR, UROBILINOGEN, NITRITE, LEUKOCYTESUR Sepsis Labs: @LABRCNTIP (procalcitonin:4,lacticidven:4)  ) Recent Results (from the past 240 hour(s))  Resp Panel by RT-PCR (Flu A&B, Covid) Nasopharyngeal Swab     Status: None   Collection Time: 07/17/21 10:23 AM   Specimen: Nasopharyngeal Swab; Nasopharyngeal(NP) swabs in vial transport medium  Result Value Ref Range Status   SARS Coronavirus 2 by  RT PCR NEGATIVE NEGATIVE Final    Comment: (NOTE) SARS-CoV-2 target nucleic acids are NOT DETECTED.  The SARS-CoV-2 RNA is generally detectable in upper respiratory specimens during the acute phase of infection. The lowest concentration of SARS-CoV-2 viral copies this assay can detect is 138 copies/mL. A negative result does not preclude SARS-Cov-2 infection and should not be used as the sole basis for treatment or other patient management decisions. A negative result may occur with  improper specimen collection/handling, submission of specimen other than nasopharyngeal swab, presence of viral mutation(s) within the areas targeted by this assay, and inadequate number of viral copies(<138 copies/mL). A negative result must be combined with clinical observations, patient history, and epidemiological information. The expected result is Negative.  Fact Sheet for Patients:  09/16/21  Fact Sheet for Healthcare Providers:  BloggerCourse.com  This test is no t yet approved or cleared by the [place] FDA and  has been authorized for detection and/or diagnosis of SARS-CoV-2 by FDA under an Emergency Use Authorization (  EUA). This EUA will remain  in effect (meaning this test can be used) for the duration of the COVID-19 declaration under Section 564(b)(1) of the Act, 21 U.S.C.section 360bbb-3(b)(1), unless the authorization is terminated  or revoked sooner.       Influenza A by PCR NEGATIVE NEGATIVE Final   Influenza B by PCR NEGATIVE NEGATIVE Final    Comment: (NOTE) The Xpert Xpress SARS-CoV-2/FLU/RSV plus assay is intended as an aid in the diagnosis of influenza from Nasopharyngeal swab specimens and should not be used as a sole basis for treatment. Nasal washings and aspirates are unacceptable for Xpert Xpress SARS-CoV-2/FLU/RSV testing.  Fact Sheet for Patients: BloggerCourse.com  Fact Sheet  for Healthcare Providers: SeriousBroker.it  This test is not yet approved or cleared by the [place] FDA and has been authorized for detection and/or diagnosis of SARS-CoV-2 by FDA under an Emergency Use Authorization (EUA). This EUA will remain in effect (meaning this test can be used) for the duration of the COVID-19 declaration under Section 564(b)(1) of the Act, 21 U.S.C. section 360bbb-3(b)(1), unless the authorization is terminated or revoked.  Performed at [facility], [address] [place], [place] [address]      Radiology Studies: No results found.   Scheduled Meds:  amiodarone  150 mg Intravenous Once   Chlorhexidine Gluconate Cloth  6 each Topical Daily   digoxin  0.125 mg Oral Daily   folic acid  1 mg Oral Daily   multivitamin with minerals  1 tablet Oral Daily   potassium chloride  40 mEq Oral BID   rosuvastatin  20 mg Oral Daily   sacubitril-valsartan  1 tablet Oral BID   sodium chloride flush  10-40 mL Intracatheter Q12H   sodium chloride flush  3 mL Intravenous Q12H   spironolactone  12.5 mg Oral Daily   thiamine  100 mg Oral Daily   Or   thiamine  100 mg Intravenous Daily   Continuous Infusions:  amiodarone 30 mg/hr (07/20/21 1058)   furosemide (LASIX) 200 mg in dextrose 5% 100 mL (2mg /mL) infusion 6 mg/hr (07/20/21 0159)   heparin 2,050 Units/hr (07/20/21 0437)   milrinone 0.25 mcg/kg/min (07/20/21 0159)     LOS: 3 days    Time spent: 09/19/21  , MD [facility]   07/20/2021, 11:28 AM

## 2021-07-20 NOTE — Plan of Care (Signed)

## 2021-07-21 ENCOUNTER — Encounter (HOSPITAL_COMMUNITY)
Admission: EM | Disposition: A | Discharge status: Home / Self Care | Payer: Self-pay | Source: Home / Self Care | Attending: Internal Medicine

## 2021-07-21 ENCOUNTER — Other Ambulatory Visit (HOSPITAL_COMMUNITY): Payer: Self-pay

## 2021-07-21 DIAGNOSIS — I428 Other cardiomyopathies: Secondary | ICD-10-CM | POA: Diagnosis not present

## 2021-07-21 HISTORY — PX: LEFT HEART CATH AND CORONARY ANGIOGRAPHY: CATH118249

## 2021-07-21 LAB — POCT I-STAT EG7
Acid-Base Excess: 2 mmol/L (ref 0.0–2.0)
Acid-Base Excess: 2 mmol/L (ref 0.0–2.0)
Bicarbonate: 28.5 mmol/L — ABNORMAL HIGH (ref 20.0–28.0)
Bicarbonate: 28.6 mmol/L — ABNORMAL HIGH (ref 20.0–28.0)
Calcium, Ion: 1.17 mmol/L (ref 1.15–1.40)
Calcium, Ion: 1.2 mmol/L (ref 1.15–1.40)
HCT: 47 % (ref 39.0–52.0)
HCT: 48 % (ref 39.0–52.0)
Hemoglobin: 16 g/dL (ref 13.0–17.0)
Hemoglobin: 16.3 g/dL (ref 13.0–17.0)
O2 Saturation: 62 %
O2 Saturation: 63 %
Potassium: 4.3 mmol/L (ref 3.5–5.1)
Potassium: 4.3 mmol/L (ref 3.5–5.1)
Sodium: 133 mmol/L — ABNORMAL LOW (ref 135–145)
Sodium: 134 mmol/L — ABNORMAL LOW (ref 135–145)
TCO2: 30 mmol/L (ref 22–32)
TCO2: 30 mmol/L (ref 22–32)
pCO2, Ven: 50.9 mmHg (ref 44–60)
pCO2, Ven: 52 mmHg (ref 44–60)
pH, Ven: 7.348 (ref 7.25–7.43)
pH, Ven: 7.356 (ref 7.25–7.43)
pO2, Ven: 34 mmHg (ref 32–45)
pO2, Ven: 35 mmHg (ref 32–45)

## 2021-07-21 LAB — COMPREHENSIVE METABOLIC PANEL
ALT: 94 U/L — ABNORMAL HIGH (ref 0–44)
AST: 40 U/L (ref 15–41)
Albumin: 3.4 g/dL — ABNORMAL LOW (ref 3.5–5.0)
Alkaline Phosphatase: 57 U/L (ref 38–126)
Anion gap: 9 (ref 5–15)
BUN: 20 mg/dL (ref 8–23)
CO2: 31 mmol/L (ref 22–32)
Calcium: 8.8 mg/dL — ABNORMAL LOW (ref 8.9–10.3)
Chloride: 87 mmol/L — ABNORMAL LOW (ref 98–111)
Creatinine, Ser: 1.33 mg/dL — ABNORMAL HIGH (ref 0.61–1.24)
GFR, Estimated: >60 mL/min (ref >60)
Glucose, Bld: 114 mg/dL — ABNORMAL HIGH (ref 70–99)
Potassium: 4.3 mmol/L (ref 3.5–5.1)
Sodium: 127 mmol/L — ABNORMAL LOW (ref 135–145)
Total Bilirubin: 0.9 mg/dL (ref 0.3–1.2)
Total Protein: 6.3 g/dL — ABNORMAL LOW (ref 6.5–8.1)

## 2021-07-21 LAB — COOXEMETRY PANEL
Carboxyhemoglobin: 2 % — ABNORMAL HIGH (ref 0.5–1.5)
Methemoglobin: 0.7 % (ref 0.0–1.5)
O2 Saturation: 70 %
Total hemoglobin: 17.8 g/dL — ABNORMAL HIGH (ref 12.0–16.0)

## 2021-07-21 LAB — HEPARIN LEVEL (UNFRACTIONATED): Heparin Unfractionated: 0.59 IU/mL (ref 0.30–0.70)

## 2021-07-21 SURGERY — LEFT HEART CATH AND CORONARY ANGIOGRAPHY
Anesthesia: LOCAL

## 2021-07-21 MED ORDER — HYDRALAZINE HCL 20 MG/ML IJ SOLN: 10.0000 mg | INTRAMUSCULAR | Status: AC | PRN

## 2021-07-21 MED ORDER — LIDOCAINE HCL (PF) 1 % IJ SOLN
INTRAMUSCULAR | Status: DC | PRN
  Administered 2021-07-21: 2 mL

## 2021-07-21 MED ORDER — HEPARIN (PORCINE) IN NACL 1000-0.9 UT/500ML-% IV SOLN
INTRAVENOUS | Status: AC
Start: 2021-07-21 — End: ?
  Filled 2021-07-21: qty 1000

## 2021-07-21 MED ORDER — IOHEXOL 350 MG/ML SOLN
INTRAVENOUS | Status: DC | PRN
  Administered 2021-07-21: 90 mL

## 2021-07-21 MED ORDER — SALINE SPRAY 0.65 % NA SOLN
1.0000 | NASAL | Status: DC | PRN
  Filled 2021-07-21: qty 44

## 2021-07-21 MED ORDER — SODIUM CHLORIDE 0.9% FLUSH: 3.0000 mL | INTRAVENOUS | Status: DC | PRN

## 2021-07-21 MED ORDER — ACETAMINOPHEN 325 MG PO TABS: 650.0000 mg | ORAL_TABLET | ORAL | Status: DC | PRN

## 2021-07-21 MED ORDER — ONDANSETRON HCL 4 MG/2ML IJ SOLN: 4.0000 mg | Freq: Four times a day (QID) | INTRAMUSCULAR | Status: DC | PRN

## 2021-07-21 MED ORDER — HEPARIN (PORCINE) IN NACL 1000-0.9 UT/500ML-% IV SOLN
INTRAVENOUS | Status: DC | PRN
  Administered 2021-07-21 (×2): 500 mL

## 2021-07-21 MED ORDER — VERAPAMIL HCL 2.5 MG/ML IV SOLN
INTRAVENOUS | Status: AC
Start: 2021-07-21 — End: ?
  Filled 2021-07-21: qty 2

## 2021-07-21 MED ORDER — SODIUM CHLORIDE 0.9 % IV SOLN: 250.0000 mL | INTRAVENOUS | Status: DC | PRN

## 2021-07-21 MED ORDER — FENTANYL CITRATE (PF) 100 MCG/2ML IJ SOLN
INTRAMUSCULAR | Status: DC | PRN
  Administered 2021-07-21: 25 ug via INTRAVENOUS

## 2021-07-21 MED ORDER — SODIUM CHLORIDE 0.9 % IV SOLN
INTRAVENOUS | Status: AC | PRN
  Administered 2021-07-21: 250 mL
  Administered 2021-07-21: 10 mL/h via INTRAVENOUS

## 2021-07-21 MED ORDER — MIDAZOLAM HCL 2 MG/2ML IJ SOLN
INTRAMUSCULAR | Status: DC | PRN
  Administered 2021-07-21: 1 mg via INTRAVENOUS

## 2021-07-21 MED ORDER — LABETALOL HCL 5 MG/ML IV SOLN: 10.0000 mg | INTRAVENOUS | Status: AC | PRN

## 2021-07-21 MED ORDER — MIDAZOLAM HCL 2 MG/2ML IJ SOLN
INTRAMUSCULAR | Status: AC
  Filled 2021-07-21: qty 2

## 2021-07-21 MED ORDER — HEPARIN (PORCINE) IN NACL 2-0.9 UNITS/ML
INTRAMUSCULAR | Status: DC | PRN
  Administered 2021-07-21: 10 mL via INTRA_ARTERIAL

## 2021-07-21 MED ORDER — APIXABAN 5 MG PO TABS
5.0000 mg | ORAL_TABLET | Freq: Two times a day (BID) | ORAL | Status: DC
  Administered 2021-07-21 – 2021-07-23 (×4): 5 mg via ORAL
  Filled 2021-07-21 (×4): qty 1

## 2021-07-21 MED ORDER — HEPARIN SODIUM (PORCINE) 1000 UNIT/ML IJ SOLN
INTRAMUSCULAR | Status: DC | PRN
  Administered 2021-07-21: 5000 [IU] via INTRAVENOUS

## 2021-07-21 MED ORDER — FENTANYL CITRATE (PF) 100 MCG/2ML IJ SOLN
INTRAMUSCULAR | Status: AC
  Filled 2021-07-21: qty 2

## 2021-07-21 MED ORDER — SODIUM CHLORIDE 0.9 % IV SOLN: INTRAVENOUS | Status: AC

## 2021-07-21 MED ORDER — LIDOCAINE HCL (PF) 1 % IJ SOLN
INTRAMUSCULAR | Status: AC
  Filled 2021-07-21: qty 30

## 2021-07-21 MED ORDER — HEPARIN SODIUM (PORCINE) 1000 UNIT/ML IJ SOLN
INTRAMUSCULAR | Status: AC
  Filled 2021-07-21: qty 10

## 2021-07-21 MED ORDER — SODIUM CHLORIDE 0.9% FLUSH
3.0000 mL | Freq: Two times a day (BID) | INTRAVENOUS | Status: DC
  Administered 2021-07-22 (×2): 3 mL via INTRAVENOUS

## 2021-07-21 SURGICAL SUPPLY — 11 items
CATH 5FR JL3.5 JR4 ANG PIG MP (CATHETERS) ×2 IMPLANT
CATH LAUNCHER 5F JL3 (CATHETERS) IMPLANT
CATHETER LAUNCHER 5F JL3 (CATHETERS) ×3
DEVICE RAD COMP TR BAND LRG (VASCULAR PRODUCTS) ×2 IMPLANT
GLIDESHEATH SLEND SS 6F .021 (SHEATH) ×2 IMPLANT
GUIDEWIRE INQWIRE 1.5J.035X260 (WIRE) IMPLANT
INQWIRE 1.5J .035X260CM (WIRE) ×6
KIT HEART LEFT (KITS) ×3 IMPLANT
PACK CARDIAC CATHETERIZATION (CUSTOM PROCEDURE TRAY) ×3 IMPLANT
TRANSDUCER W/STOPCOCK (MISCELLANEOUS) ×3 IMPLANT
TUBING CIL FLEX 10 FLL-RA (TUBING) ×3 IMPLANT

## 2021-07-21 NOTE — Interval H&P Note (Signed)
History and Physical Interval Note: ? ?07/21/2021 ?9:48 AM ? ?Christopher Romero  has presented today for surgery, with the diagnosis of cad.  The various methods of treatment have been discussed with the patient and family. After consideration of risks, benefits and other options for treatment, the patient has consented to  Procedure(s): ?LEFT HEART CATH AND CORONARY ANGIOGRAPHY (N/A) as a surgical intervention.  The patient's history has been reviewed, patient examined, no change in status, stable for surgery.  I have reviewed the patient's chart and labs.  Questions were answered to the patient's satisfaction.   ? ? ?Christopher Romero Shirlee Latch ? ? ?

## 2021-07-21 NOTE — Progress Notes (Addendum)
Patient ID: Christopher Romero, male   DOB: 04/13/1961, 61 y.o.   MRN: [id number]     Advanced Heart Failure Rounding Note  PCP-Cardiologist: Donato Schultz, MD   Subjective:    3.L UOP charted yesterday. Weight down another 3 lb.   Became hypotensive last night with BP down to 80s/50s. CVP 3-4 at that time. Lasix gtt decreased to 6/hr but still running at 8/hr on [facility]. Co-ox 66>>70% on 0.25 milrinone.   SBP now improved to 100s-110s.   Scr 2.03>1.86>1.7>1.33  Na 136>132>127  Feeling well. Dyspnea significantly improved. Has been up ambulating in the room.  Going for LHC today.  RHC Procedural Findings: Hemodynamics (mmHg) RA mean 21 RV 46/22 PA 47/39, mean 40 PCWP mean 30 Oxygen saturations: PA 62% AO 99% Cardiac Output (Fick) 4.21  Cardiac Index (Fick) 1.68 PVR 2.4 WU  Objective:   Weight Range: 127.9 kg Body mass index is 40.45 kg/m.   Vital Signs:   Temp:  [97.6 F (36.4 C)-98.2 F (36.8 C)] 97.7 F (36.5 C) (03/06 0400) Pulse Rate:  [80-93] 90 (03/06 0400) Resp:  [15-20] 15 (03/06 0400) BP: (87-142)/(53-81) 114/81 (03/06 0400) SpO2:  [95 %-99 %] 97 % (03/05 2359) Weight:  [127.9 kg] 127.9 kg (03/06 0400) Last BM Date : 07/18/21  Weight change: Filed Weights   07/19/21 0334 07/20/21 0450 07/21/21 0400  Weight: 131.6 kg 128.9 kg 127.9 kg    Intake/Output:   Intake/Output Summary (Last 24 hours) at 07/21/2021 0753 Last data filed at 07/21/2021 0400 Gross per 24 hour  Intake 1726.69 ml  Output 3141 ml  Net -1414.31 ml      Physical Exam    General:  No distress. Sitting up on side of bed. HEENT: normal Neck: supple. JVP flat. Carotids 2+ bilat; no bruits.  Cor: PMI nondisplaced. Regular rate & rhythm. No rubs, gallops or murmurs. Lungs: clear Abdomen: soft, nontender, nondistended, obese. No hepatosplenomegaly.  Extremities: no cyanosis, clubbing, rash, trace edema, + LUE PICC Neuro: alert & orientedx3, cranial nerves grossly intact. moves all 4  extremities w/o difficulty. Affect pleasant   Telemetry   SR 70s-80s (personally reviewed)  Labs    CBC Recent Labs    07/20/21 0428  WBC 7.8  HGB 16.6  HCT 48.8  MCV 89.4  PLT 253   Basic Metabolic Panel Recent Labs    33/82/50 1309 07/19/21 0415 07/20/21 0428 07/21/21 0425  NA 130*   < > 132* 127*  K 4.3   < > 3.7 4.3  CL 95*   < > 85* 87*  CO2 21*   < > 36* 31  GLUCOSE 122*   < > 110* 114*  BUN 44*   < > 25* 20  CREATININE 1.86*   < > 1.36* 1.33*  CALCIUM 8.6*   < > 8.8* 8.8*  MG 2.2  --   --   --    < > = values in this interval not displayed.   Liver Function Tests Recent Labs    07/20/21 0428 07/21/21 0425  AST 44* 40  ALT 121* 94*  ALKPHOS 55 57  BILITOT 0.6 0.9  PROT 6.2* 6.3*  ALBUMIN 3.4* 3.4*   No results for input(s): LIPASE, AMYLASE in the last 72 hours. Cardiac Enzymes No results for input(s): CKTOTAL, CKMB, CKMBINDEX, TROPONINI in the last 72 hours.  BNP: BNP (last 3 results) No results for input(s): BNP in the last 8760 hours.  ProBNP (last 3 results) No results for input(s): PROBNP  in the last 8760 hours.   D-Dimer No results for input(s): DDIMER in the last 72 hours. Hemoglobin A1C No results for input(s): HGBA1C in the last 72 hours.  Fasting Lipid Panel No results for input(s): CHOL, HDL, LDLCALC, TRIG, CHOLHDL, LDLDIRECT in the last 72 hours.  Thyroid Function Tests No results for input(s): TSH, T4TOTAL, T3FREE, THYROIDAB in the last 72 hours.  Invalid input(s): FREET3   Other results:   Imaging    No results found.   Medications:     Scheduled Medications:  amiodarone  150 mg Intravenous Once   Chlorhexidine Gluconate Cloth  6 each Topical Daily   digoxin  0.125 mg Oral Daily   folic acid  1 mg Oral Daily   multivitamin with minerals  1 tablet Oral Daily   rosuvastatin  20 mg Oral Daily   sacubitril-valsartan  1 tablet Oral BID   sodium chloride flush  10-40 mL Intracatheter Q12H   sodium chloride  flush  3 mL Intravenous Q12H   sodium chloride flush  3 mL Intravenous Q12H   spironolactone  12.5 mg Oral Daily   thiamine  100 mg Oral Daily   Or   thiamine  100 mg Intravenous Daily    Infusions:  sodium chloride     sodium chloride     amiodarone 30 mg/hr (07/20/21 2152)   furosemide (LASIX) 200 mg in dextrose 5% 100 mL (2mg /mL) infusion 8 mg/hr (07/20/21 1311)   heparin 2,050 Units/hr (07/21/21 0552)   milrinone 0.25 mcg/kg/min (07/21/21 0549)    PRN Medications: sodium chloride, acetaminophen **OR** acetaminophen, albuterol, cyclobenzaprine, ondansetron **OR** ondansetron (ZOFRAN) IV, sodium chloride flush, sodium chloride flush    Assessment/Plan   1. Acute on chronic systolic CHF:  Patient has no prior history of cardiac disease. Admitted with several weeks of fatigue, palpitations.  Noted to be in atrial flutter with RVR.  Echo with EF 20-25%, global hypokinesis, severe LV dilation, mildly dilated and moderately dysfunctional RV, moderate-severe functional MR, dilated IVC.  He developed AKI with creatinine rising 0.98 => 2 without aggressive diuresis. RHC 3/3 showed severely elevated right and left heart filling pressures with low cardiac output.  Cause of cardiomyopathy uncertain.  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  However, need to rule out CAD (prior smoker, HTN).  Initial troponin mildly elevated.  Viral myocarditis would also be a consideration.  He is now on milrinone 0.25 with co-ox 70%%. Excellent diuresis on Lasix gtt. Down another 3 lb overnight and 27 lb since admit. CVP 3 this am. Stop lasix gtt. Will likely transition to PO diuretic tomorrow.  - Start milrinone wean. Decrease milrinone to 0.125. Follow co-ox. - He is back in NSR on amiodarone gtt.  - Continue spironolactone 12.5 daily.  - Continue digoxin 0.125 daily.  - Continue Entresto 24/26 bid.  - Add Farxiga next - Watch BP with episode hypotension last night. SBP now 100s-110s. -  Coronary angiography later this morning - If no significant coronary disease on cath, will need cMRI.  2. Atrial flutter: With RVR.  May have been present several weeks prior to admission.  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 Now back in NSR on amiodarone gtt.  - Continue amiodarone gtt while on milrinone, then to po.  - Continue heparin gtt until after cath.  - Will need outpatient EP evaluation to consider flutter ablation.  3. AKI: Suspect cardiorenal in setting of low output HF.  Creatinine improving with diuresis and milrinone.  1.33 today. -  Maintain cardiac output and MAP.  4. Mitral regurgitation: Moderate-severe, suspect functional.  - Reassess MR eventually when in NSR and after full diuresis.  5. Hyponatremia: - Na 937>169>678 - ? D/t volume depletion - Daily BMET   Length of Stay: 4  FINCH, LINDSAY N, PA-C  07/21/2021, 7:53 AM  Advanced Heart Failure Team Pager [phone] (M-F; 7a - 5p)  Please contact [facility] for night-coverage after hours (5p -7a ) and weekends on [email or web address]   Patient seen with PA, agree with the above note.   He diuresed well again, CVP down to 3.  Creatinine stable 1.3.  Co-ox 70%.  Feels better overall.  Remains in NSR.   Coronary angiography today with nonobstructive mild disease, LVEDP 11.   General: NAD Neck: No JVD, no thyromegaly or thyroid nodule.  Lungs: Clear to auscultation bilaterally with normal respiratory effort. CV: Nondisplaced PMI.  Heart regular S1/S2, no S3/S4, no murmur.  No peripheral edema.   Abdomen: Soft, nontender, no hepatosplenomegaly, no distention.  Skin: Intact without lesions or rashes.  Neurologic: Alert and oriented x 3.  Psych: Normal affect. Extremities: No clubbing or cyanosis.  HEENT: Normal.   Improved with low CVP and co-ox 70%.  Nonischemic cardiomyopathy based on cath.  ?Tachycardia-mediated versus other (myocarditis, etc).  Now in NSR on amiodarone.  - Stop IV Lasix.  Follow CVP for need to start po  diuretic.  - Decrease milrinone to 0.125, hopefully start tomorrow.  - Hold pm Entresto with low BP (aggressive diuresis).  Hopefully can restart Entresto 24/26 bid tomorrow morning.  - Will not titrate other meds with soft BP.  - Gentle IV fluid post-cath.  - Continue amiodarone gtt while on milrinone, transition to po when off milrinone.  - Can start apixaban tonight (stop heparin gtt).  - Cardiac MRI.   Marca Ancona 07/21/2021 10:37 AM

## 2021-07-21 NOTE — Plan of Care (Signed)

## 2021-07-21 NOTE — Progress Notes (Signed)
ANTICOAGULATION CONSULT NOTE ? ?Pharmacy Consult for Heparin ?Indication: atrial fibrillation ? ?No Known Allergies ? ?Patient Measurements: ?Height: 5\' 10"  (177.8 cm) ?Weight: 127.9 kg (281 lb 14.4 oz) ?IBW/kg (Calculated) : 73 ? ?Heparin Dosing Weight: 106 kg ? ?Vital Signs: ?Temp: 97.5 ?F (36.4 ?C) (03/06 1045) ?Temp Source: Oral (03/06 1045) ?BP: 125/45 (03/06 1045) ?Pulse Rate: 79 (03/06 1045) ? ?Labs: ?Recent Labs  ?  07/18/21 ?1309 07/18/21 ?2000 07/19/21 ?0410 07/19/21 ?[id number] 07/19/21 ?1454 07/19/21 ?2221 07/20/21 ?[id number] 07/20/21 ?[id number] 07/21/21 ?0425 07/21/21 ?0444  ?HGB  --   --   --   --   --   --  16.6  --   --   --   ?HCT  --   --   --   --   --   --  48.8  --   --   --   ?PLT  --   --   --   --   --   --  253  --   --   --   ?APTT  --    < > 41*  --  66* 82*  --  77*  --   --   ?HEPARINUNFRC  --    < > 0.75*  --   --   --   --  0.47  --  0.59  ?CREATININE 1.86*  --   --  1.70*  --   --  1.36*  --  1.33*  --   ?TROPONINIHS 93*  --   --   --   --   --   --   --   --   --   ? < > = values in this interval not displayed.  ? ? ? ?Estimated Creatinine Clearance: 78.4 mL/min (A) (by C-G formula based on SCr of 1.33 mg/dL (H)). ? ? ?Assessment: ?15 YOM presenting with hypertension, new onset afib w/RVR on diltiazem gtt, he is not on anticoagulation PTA however was started on Xarelto in ED last dose 3/02 @1238 , now plans for [facility]. Pharmacy consulted to manage heparin. ? ?Pt s/p LHC to transition to apixaban. ? ?Goal of Therapy:  ?Heparin level 0.3-0.7 units/ml ?aPTT 66-102 seconds ?Monitor platelets by anticoagulation protocol: Yes ?  ?Plan:  ? Stop heparin ? Apixaban 5mg  BID tonight ? ?Arrie Senate, PharmD, BCPS, BCCP ?Clinical Pharmacist ?[phone] ?Please check AMION for all [facility] numbers ?07/21/2021 ? ? ?

## 2021-07-21 NOTE — Progress Notes (Signed)
PROGRESS NOTE    Christopher Romero  [id number] DOB: 08-Jun-1960 DOA: 07/17/2021 PCP: Pcp, No  Narrative: 60/M with history of hypertension and dyslipidemia presented to the ED with palpitations, ran out of meds in January, kept feeling funny went to his PCP recently noted to have abnormal liver enzymes, continued to have increased heart rate and shortness of breath.  Presented to the ED noted to be in atrial flutter, with heart rate in the 130-140 range, also noted to have CHF. -Echo - EF of 20-25% -Cardiology following , 3/2 he was switched from IV Cardizem to p.o. metoprolol, subsequently dropped BP to 60-70s overnight, followed by bump in creatinine to 2.0, metoprolol discontinued, started on amnio gtt. -Heart failure team consulted, now on Lasix and milrinone drip -Diuresing well, creatinine improving  Subjective: -Feels better overall, no events overnight, cramps improving  Assessment and Plan:  Acute systolic CHF Moderate to severe MR low output state -Echo noted EF of 20-25%, suspect cardiomyopathy -tachycardia mediated versus EtOH -Advanced heart failure team consulting, right heart cath 3/3 noted significantly elevated right and left-sided filling pressures, low cardiac output -Started on Lasix and milrinone gtt., significant diuresis noted, he is 16 L negative -Lasix GTT discontinued today, milrinone weaning, continue Aldactone digoxin -CHF team following, plan for left heart cath today  -BMP in a.m.  Atrial flutter with RVR -Heart rate in the 130-150 range on admission -BP dropped after metoprolol, then started amiodarone gtt. -Now in sinus rhythm, heart rate improved -Continue IV heparin  Acute kidney injury Hyperkalemia -Likely secondary to cardiorenal, low output state, and component of ATN from hypotension -Baseline creatinine is normal, improving on milrinone -Avoid hypotension, BP stable now  Abnormal LFTs -Likely secondary to passive hepatic congestion, low  output, AST ALT pattern not consistent with EtOH -Improving, trend  Obesity -BMI 44 -Needs weight loss, lifestyle modification  EtOH use -Counseled, drinks 2 beers every day and 6-10 on weekends -Continue thiamine, no withdrawal noted  DVT prophylaxis: IV heparin Code Status: DNR Family Communication: No family at bedside Disposition Plan: Home pending cardiac work-up  Consultants:  Cardiology, heart failure  Procedures: 1. Elevated right and left heart filling pressures, biventricular failure.  2. Mild pulmonary venous hypertension.  3. Low cardiac output.     Antimicrobials:    Objective: Vitals:   07/21/21 1100 07/21/21 1130 07/21/21 1200 07/21/21 1250  BP: 125/72 (!) 111/42 (!) 125/115 (!) 117/59  Pulse:      Resp: 12 13  19   Temp:      TempSrc:      SpO2:      Weight:      Height:        Intake/Output Summary (Last 24 hours) at 07/21/2021 1409 Last data filed at 07/21/2021 1200 Gross per 24 hour  Intake 1815.14 ml  Output 3041 ml  Net -1225.86 ml   Filed Weights   07/19/21 0334 07/20/21 0450 07/21/21 0400  Weight: 131.6 kg 128.9 kg 127.9 kg    Examination:  General exam: Obese pleasant male laying in bed, AAOx3, no distress HEENT: No JVD CVS: S1-S2, regular rhythm Lungs: Clear  Abdomen: Soft, nontender, bowel sounds present Extremities: No edema Skin: No rashes Psychiatry: Judgement and insight appear normal. Mood & affect appropriate.     Data Reviewed:   CBC: Recent Labs  Lab 07/17/21 1028 07/18/21 0632 07/20/21 0428  WBC 8.6 9.5 7.8  NEUTROABS 5.9  --   --   HGB 15.8 15.7 16.6  HCT 48.9 48.1 48.8  MCV 94.0 92.7 89.4  PLT 335 294 [id number]   Basic Metabolic Panel: Recent Labs  Lab 07/18/21 0632 07/18/21 1309 07/19/21 0415 07/20/21 0428 07/21/21 0425  NA 133* 130* 136 132* 127*  K 5.4* 4.3 3.5 3.7 4.3  CL 95* 95* 89* 85* 87*  CO2 30 21* 33* 36* 31  GLUCOSE 117* 122* 107* 110* 114*  BUN 43* 44* 39* 25* 20  CREATININE 2.03* 1.86*  1.70* 1.36* 1.33*  CALCIUM 8.9 8.6* 9.3 8.8* 8.8*  MG  --  2.2  --   --   --    GFR: Estimated Creatinine Clearance: 78.4 mL/min (A) (by C-G formula based on SCr of 1.33 mg/dL (H)). Liver Function Tests: Recent Labs  Lab 07/17/21 1028 07/18/21 0632 07/19/21 0415 07/20/21 0428 07/21/21 0425  AST 71* 93* 61* 44* 40  ALT 188* 191* 156* 121* 94*  ALKPHOS 47 56 54 55 57  BILITOT 0.7 1.2 0.5 0.6 0.9  PROT 6.0* 5.7* 6.3* 6.2* 6.3*  ALBUMIN 3.5 3.3* 3.4* 3.4* 3.4*   No results for input(s): LIPASE, AMYLASE in the last 168 hours. No results for input(s): AMMONIA in the last 168 hours. Coagulation Profile: Recent Labs  Lab 07/17/21 1028  INR 1.1   Cardiac Enzymes: No results for input(s): CKTOTAL, CKMB, CKMBINDEX, TROPONINI in the last 168 hours. BNP (last 3 results) No results for input(s): PROBNP in the last 8760 hours. HbA1C: No results for input(s): HGBA1C in the last 72 hours.  CBG: Recent Labs  Lab 07/17/21 2221  GLUCAP 173*   Lipid Profile: No results for input(s): CHOL, HDL, LDLCALC, TRIG, CHOLHDL, LDLDIRECT in the last 72 hours.  Thyroid Function Tests: No results for input(s): TSH, T4TOTAL, FREET4, T3FREE, THYROIDAB in the last 72 hours.  Anemia Panel: No results for input(s): VITAMINB12, FOLATE, FERRITIN, TIBC, IRON, RETICCTPCT in the last 72 hours. Urine analysis: No results found for: COLORURINE, APPEARANCEUR, LABSPEC, PHURINE, GLUCOSEU, HGBUR, BILIRUBINUR, KETONESUR, PROTEINUR, UROBILINOGEN, NITRITE, LEUKOCYTESUR Sepsis Labs: @LABRCNTIP (procalcitonin:4,lacticidven:4)  ) Recent Results (from the past 240 hour(s))  Resp Panel by RT-PCR (Flu A&B, Covid) Nasopharyngeal Swab     Status: None   Collection Time: 07/17/21 10:23 AM   Specimen: Nasopharyngeal Swab; Nasopharyngeal(NP) swabs in vial transport medium  Result Value Ref Range Status   SARS Coronavirus 2 by RT PCR NEGATIVE NEGATIVE Final    Comment: (NOTE) SARS-CoV-2 target nucleic acids are NOT  DETECTED.  The SARS-CoV-2 RNA is generally detectable in upper respiratory specimens during the acute phase of infection. The lowest concentration of SARS-CoV-2 viral copies this assay can detect is 138 copies/mL. A negative result does not preclude SARS-Cov-2 infection and should not be used as the sole basis for treatment or other patient management decisions. A negative result may occur with  improper specimen collection/handling, submission of specimen other than nasopharyngeal swab, presence of viral mutation(s) within the areas targeted by this assay, and inadequate number of viral copies(<138 copies/mL). A negative result must be combined with clinical observations, patient history, and epidemiological information. The expected result is Negative.  Fact Sheet for Patients:  EntrepreneurPulse.com.au  Fact Sheet for Healthcare Providers:  IncredibleEmployment.be  This test is no t yet approved or cleared by the [place] FDA and  has been authorized for detection and/or diagnosis of SARS-CoV-2 by FDA under an Emergency Use Authorization (EUA). This EUA will remain  in effect (meaning this test can be used) for the duration of the COVID-19 declaration under Section 564(b)(1) of the Act, 21 U.S.C.section  360bbb-3(b)(1), unless the authorization is terminated  or revoked sooner.       Influenza A by PCR NEGATIVE NEGATIVE Final   Influenza B by PCR NEGATIVE NEGATIVE Final    Comment: (NOTE) The Xpert Xpress SARS-CoV-2/FLU/RSV plus assay is intended as an aid in the diagnosis of influenza from Nasopharyngeal swab specimens and should not be used as a sole basis for treatment. Nasal washings and aspirates are unacceptable for Xpert Xpress SARS-CoV-2/FLU/RSV testing.  Fact Sheet for Patients: EntrepreneurPulse.com.au  Fact Sheet for Healthcare Providers: IncredibleEmployment.be  This test is not yet  approved or cleared by the [place] FDA and has been authorized for detection and/or diagnosis of SARS-CoV-2 by FDA under an Emergency Use Authorization (EUA). This EUA will remain in effect (meaning this test can be used) for the duration of the COVID-19 declaration under Section 564(b)(1) of the Act, 21 U.S.C. section 360bbb-3(b)(1), unless the authorization is terminated or revoked.  Performed at [facility], [place] [address], [place], [place] [address]      Radiology Studies: CARDIAC CATHETERIZATION  Result Date: 07/21/2021   Mid LAD lesion is 25% stenosed. 1. Mild nonobstructive CAD. 2. LVEDP 11 Nonischemic cardiomyopathy.     Scheduled Meds:  amiodarone  150 mg Intravenous Once   apixaban  5 mg Oral BID   Chlorhexidine Gluconate Cloth  6 each Topical Daily   digoxin  0.125 mg Oral Daily   folic acid  1 mg Oral Daily   multivitamin with minerals  1 tablet Oral Daily   rosuvastatin  20 mg Oral Daily   sodium chloride flush  10-40 mL Intracatheter Q12H   sodium chloride flush  3 mL Intravenous Q12H   sodium chloride flush  3 mL Intravenous Q12H   sodium chloride flush  3 mL Intravenous Q12H   spironolactone  12.5 mg Oral Daily   thiamine  100 mg Oral Daily   Or   thiamine  100 mg Intravenous Daily   Continuous Infusions:  sodium chloride 75 mL/hr at 07/21/21 1200   sodium chloride     amiodarone 30 mg/hr (07/21/21 0904)   milrinone 0.1244 mcg/kg/min (07/21/21 0835)     LOS: 4 days    Time spent: 17min  Domenic Polite, MD [facility]   07/21/2021, 2:09 PM

## 2021-07-21 NOTE — TOC Benefit Eligibility Note (Signed)
Patient Advocate Encounter ?  ?Insurance verification completed.   ?  ?The patient is currently admitted and upon discharge could be taking ELIQUIS 5MG . ?  ?Prior Authorization is required and has been initiated. KEY: [id number] ? ? ?PA has been approved! ?The current 30 day co-pay is, $35. ?PA valid until 3.5.24  ? ?The patient is insured through 5.5.24. ? ? ?  ? ? ?

## 2021-07-22 ENCOUNTER — Encounter: Payer: Self-pay | Admitting: Cardiology

## 2021-07-22 ENCOUNTER — Inpatient Hospital Stay (HOSPITAL_COMMUNITY): Payer: Managed Care, Other (non HMO)

## 2021-07-22 ENCOUNTER — Other Ambulatory Visit (HOSPITAL_COMMUNITY): Payer: Self-pay

## 2021-07-22 ENCOUNTER — Encounter (HOSPITAL_COMMUNITY): Payer: Self-pay | Admitting: Cardiology

## 2021-07-22 LAB — BASIC METABOLIC PANEL
Anion gap: 7 (ref 5–15)
BUN: 19 mg/dL (ref 8–23)
CO2: 27 mmol/L (ref 22–32)
Calcium: 8.8 mg/dL — ABNORMAL LOW (ref 8.9–10.3)
Chloride: 94 mmol/L — ABNORMAL LOW (ref 98–111)
Creatinine, Ser: 1.27 mg/dL — ABNORMAL HIGH (ref 0.61–1.24)
GFR, Estimated: >60 mL/min (ref >60)
Glucose, Bld: 100 mg/dL — ABNORMAL HIGH (ref 70–99)
Potassium: 4.5 mmol/L (ref 3.5–5.1)
Sodium: 128 mmol/L — ABNORMAL LOW (ref 135–145)

## 2021-07-22 LAB — COOXEMETRY PANEL
Carboxyhemoglobin: 1.9 % — ABNORMAL HIGH (ref 0.5–1.5)
Methemoglobin: 0.7 % (ref 0.0–1.5)
O2 Saturation: 74.4 %
Total hemoglobin: 17.9 g/dL — ABNORMAL HIGH (ref 12.0–16.0)

## 2021-07-22 LAB — CBC
HCT: 51.7 % (ref 39.0–52.0)
Hemoglobin: 17.6 g/dL — ABNORMAL HIGH (ref 13.0–17.0)
MCH: 29.9 pg (ref 26.0–34.0)
MCHC: 34 g/dL (ref 30.0–36.0)
MCV: 87.9 fL (ref 80.0–100.0)
Platelets: 268 10*3/uL (ref 150–400)
RBC: 5.88 MIL/uL — ABNORMAL HIGH (ref 4.22–5.81)
RDW: 13.3 % (ref 11.5–15.5)
WBC: 6.8 10*3/uL (ref 4.0–10.5)
nRBC: 0 % (ref 0.0–0.2)

## 2021-07-22 LAB — COMPREHENSIVE METABOLIC PANEL
ALT: 74 U/L — ABNORMAL HIGH (ref 0–44)
AST: 33 U/L (ref 15–41)
Albumin: 3.2 g/dL — ABNORMAL LOW (ref 3.5–5.0)
Alkaline Phosphatase: 51 U/L (ref 38–126)
Anion gap: 6 (ref 5–15)
BUN: 15 mg/dL (ref 8–23)
CO2: 26 mmol/L (ref 22–32)
Calcium: 8.6 mg/dL — ABNORMAL LOW (ref 8.9–10.3)
Chloride: 92 mmol/L — ABNORMAL LOW (ref 98–111)
Creatinine, Ser: 1.03 mg/dL (ref 0.61–1.24)
GFR, Estimated: >60 mL/min (ref >60)
Glucose, Bld: 99 mg/dL (ref 70–99)
Potassium: 4.4 mmol/L (ref 3.5–5.1)
Sodium: 124 mmol/L — ABNORMAL LOW (ref 135–145)
Total Bilirubin: 1.1 mg/dL (ref 0.3–1.2)
Total Protein: 5.8 g/dL — ABNORMAL LOW (ref 6.5–8.1)

## 2021-07-22 MED ORDER — AMIODARONE HCL 200 MG PO TABS
200.0000 mg | ORAL_TABLET | Freq: Two times a day (BID) | ORAL | Status: DC
  Administered 2021-07-22 – 2021-07-23 (×3): 200 mg via ORAL
  Filled 2021-07-22 (×3): qty 1

## 2021-07-22 MED ORDER — SPIRONOLACTONE 25 MG PO TABS
25.0000 mg | ORAL_TABLET | Freq: Every day | ORAL | Status: DC
Start: 2021-07-22 — End: 2021-07-23
  Administered 2021-07-22 – 2021-07-23 (×2): 25 mg via ORAL
  Filled 2021-07-22 (×2): qty 1

## 2021-07-22 MED ORDER — SACUBITRIL-VALSARTAN 24-26 MG PO TABS
1.0000 | ORAL_TABLET | Freq: Two times a day (BID) | ORAL | Status: DC
  Administered 2021-07-22 – 2021-07-23 (×3): 1 via ORAL
  Filled 2021-07-22 (×3): qty 1

## 2021-07-22 MED ORDER — DAPAGLIFLOZIN PROPANEDIOL 10 MG PO TABS
10.0000 mg | ORAL_TABLET | Freq: Every day | ORAL | Status: DC
  Administered 2021-07-22 – 2021-07-23 (×2): 10 mg via ORAL
  Filled 2021-07-22 (×2): qty 1

## 2021-07-22 NOTE — TOC CM/SW Note (Signed)
HF TOC CM spoke to pt at bedside. Will prefer meds come up from [facility] pharmacy at [place]. States his sister will provide transportation home. Isidoro Donning RN3 CCM, Heart Failure TOC CM [phone]  ?

## 2021-07-22 NOTE — Plan of Care (Signed)

## 2021-07-22 NOTE — TOC Benefit Eligibility Note (Signed)
Patient Advocate Encounter ?  ?Insurance verification completed.   ?  ?The patient is currently admitted and upon discharge could be taking FARXIGA. ?  ?The current 30 day co-pay is, $15.  ? ?The patient is insured through [facility]. ? ? ?PA was required ?KEY: [id number]  ?PA is approved from 2.5.23 to 3.6.24 ? ? ?  ? ?

## 2021-07-22 NOTE — Progress Notes (Signed)
PROGRESS NOTE    Christopher Romero  KPT:[id number] DOB: 05-02-1961 DOA: 07/17/2021 PCP: Pcp, No  Narrative: 60/M with history of hypertension and dyslipidemia presented to the ED with palpitations, ran out of meds in January, kept feeling funny went to his PCP recently noted to have abnormal liver enzymes, continued to have increased heart rate and shortness of breath.  Presented to the ED noted to be in atrial flutter, with heart rate in the 130-140 range, also noted to have CHF. -Echo - EF of 20-25% -Hospital course notable for low output failure and Aflutter RVR-treated with amiodarone gtt. -Heart failure team consulted, diuresed well on Lasix and milrinone drip  Subjective: -Feels better overall, no events overnight, cramps improving  Assessment and Plan:  Acute systolic CHF Moderate to severe MR low output state -Echo noted EF of 20-25%, suspect cardiomyopathy -tachycardia mediated versus EtOH -Advanced heart failure team consulting, right heart cath 3/3 noted significantly elevated right and left-sided filling pressures, low cardiac output -Started on Lasix and milrinone gtt., significant diuresis noted, he is 16 L negative -Lasix discontinued yesterday, weaning off milrinone today -Continue Aldactone, digoxin, starting Entresto -CHF team following, left heart cath yesterday with minimal nonobstructive CAD -BMP in a.m.  Atrial flutter with RVR -Heart rate in the 130-150 range on admission -BP dropped after metoprolol, then started amiodarone gtt. -Now in sinus rhythm, heart rate improved, switch to p.o. amiodarone today -Start Eliquis, heparin discontinued  Acute kidney injury Hyperkalemia -Likely secondary to cardiorenal, low output state, and component of ATN from hypotension -Baseline creatinine is normal, improving on milrinone -Avoid hypotension, BP stable now  Hyponatremia -Sodium has trended down slowly despite improvement in volume status -Continue to trend, he is  asymptomatic, felt to be from excess water intake -TSH normal on admission, fluid restriction, BMP in a.m.  Abnormal LFTs -Likely secondary to passive hepatic congestion, low output, AST ALT pattern not consistent with EtOH -Improving, trend  Obesity -BMI 44 -Needs weight loss, lifestyle modification  EtOH use -Counseled, drinks 2 beers every day and 6-10 on weekends -Continue thiamine, no withdrawal noted  DVT prophylaxis: Apixaban Code Status: DNR Family Communication: No family at bedside Disposition Plan: Home in 1 to 2 days  Consultants:  Cardiology, heart failure  Procedures: 1. Elevated right and left heart filling pressures, biventricular failure.  2. Mild pulmonary venous hypertension.  3. Low cardiac output.     Antimicrobials:    Objective: Vitals:   07/21/21 1946 07/22/21 0000 07/22/21 0422 07/22/21 1158  BP: (!) 120/57 131/75 125/78 119/82  Pulse: 83 81 78 86  Resp: 14 15 14 14   Temp: 97.8 F (36.6 C) (!) 97.5 F (36.4 C) (!) 97.5 F (36.4 C) 97.8 F (36.6 C)  TempSrc: Oral Oral Oral Oral  SpO2: 97% 96% 93% 97%  Weight:   127.8 kg   Height:        Intake/Output Summary (Last 24 hours) at 07/22/2021 1201 Last data filed at 07/22/2021 1157 Gross per 24 hour  Intake 1114.82 ml  Output 2400 ml  Net -1285.18 ml   Filed Weights   07/20/21 0450 07/21/21 0400 07/22/21 0422  Weight: 128.9 kg 127.9 kg 127.8 kg    Examination:  General exam: Obese pleasant male sitting up in bed, AAOx3, no distress HEENT: No JVD CVS: S1-S2, regular rate rhythm Lungs: Clear bilaterally Abdomen: Soft, nontender, bowel sounds present Extremities: No edema  Skin: No rashes Psychiatry: Judgement and insight appear normal. Mood & affect appropriate.  Data Reviewed:   CBC: Recent Labs  Lab 07/17/21 1028 07/18/21 0632 07/18/21 1117 07/18/21 1118 07/20/21 0428 07/22/21 0420  WBC 8.6 9.5  --   --  7.8 6.8  NEUTROABS 5.9  --   --   --   --   --   HGB 15.8  15.7 16.3 16.0 16.6 17.6*  HCT 48.9 48.1 48.0 47.0 48.8 51.7  MCV 94.0 92.7  --   --  89.4 87.9  PLT 335 294  --   --  253 268   Basic Metabolic Panel: Recent Labs  Lab 07/18/21 1309 07/19/21 0415 07/20/21 0428 07/21/21 0425 07/22/21 0420  NA 130* 136 132* 127* 124*  K 4.3 3.5 3.7 4.3 4.4  CL 95* 89* 85* 87* 92*  CO2 21* 33* 36* 31 26  GLUCOSE 122* 107* 110* 114* 99  BUN 44* 39* 25* 20 15  CREATININE 1.86* 1.70* 1.36* 1.33* 1.03  CALCIUM 8.6* 9.3 8.8* 8.8* 8.6*  MG 2.2  --   --   --   --    GFR: Estimated Creatinine Clearance: 101.1 mL/min (by C-G formula based on SCr of 1.03 mg/dL). Liver Function Tests: Recent Labs  Lab 07/18/21 2505 07/19/21 0415 07/20/21 0428 07/21/21 0425 07/22/21 0420  AST 93* 61* 44* 40 33  ALT 191* 156* 121* 94* 74*  ALKPHOS 56 54 55 57 51  BILITOT 1.2 0.5 0.6 0.9 1.1  PROT 5.7* 6.3* 6.2* 6.3* 5.8*  ALBUMIN 3.3* 3.4* 3.4* 3.4* 3.2*   No results for input(s): LIPASE, AMYLASE in the last 168 hours. No results for input(s): AMMONIA in the last 168 hours. Coagulation Profile: Recent Labs  Lab 07/17/21 1028  INR 1.1   Cardiac Enzymes: No results for input(s): CKTOTAL, CKMB, CKMBINDEX, TROPONINI in the last 168 hours. BNP (last 3 results) No results for input(s): PROBNP in the last 8760 hours. HbA1C: No results for input(s): HGBA1C in the last 72 hours.  CBG: Recent Labs  Lab 07/17/21 2221  GLUCAP 173*   Lipid Profile: No results for input(s): CHOL, HDL, LDLCALC, TRIG, CHOLHDL, LDLDIRECT in the last 72 hours.  Thyroid Function Tests: No results for input(s): TSH, T4TOTAL, FREET4, T3FREE, THYROIDAB in the last 72 hours.  Anemia Panel: No results for input(s): VITAMINB12, FOLATE, FERRITIN, TIBC, IRON, RETICCTPCT in the last 72 hours. Urine analysis: No results found for: COLORURINE, APPEARANCEUR, LABSPEC, PHURINE, GLUCOSEU, HGBUR, BILIRUBINUR, KETONESUR, PROTEINUR, UROBILINOGEN, NITRITE, LEUKOCYTESUR Sepsis  Labs: @LABRCNTIP (procalcitonin:4,lacticidven:4)  ) Recent Results (from the past 240 hour(s))  Resp Panel by RT-PCR (Flu A&B, Covid) Nasopharyngeal Swab     Status: None   Collection Time: 07/17/21 10:23 AM   Specimen: Nasopharyngeal Swab; Nasopharyngeal(NP) swabs in vial transport medium  Result Value Ref Range Status   SARS Coronavirus 2 by RT PCR NEGATIVE NEGATIVE Final    Comment: (NOTE) SARS-CoV-2 target nucleic acids are NOT DETECTED.  The SARS-CoV-2 RNA is generally detectable in upper respiratory specimens during the acute phase of infection. The lowest concentration of SARS-CoV-2 viral copies this assay can detect is 138 copies/mL. A negative result does not preclude SARS-Cov-2 infection and should not be used as the sole basis for treatment or other patient management decisions. A negative result may occur with  improper specimen collection/handling, submission of specimen other than nasopharyngeal swab, presence of viral mutation(s) within the areas targeted by this assay, and inadequate number of viral copies(<138 copies/mL). A negative result must be combined with clinical observations, patient history, and epidemiological information. The expected  result is Negative.  Fact Sheet for Patients:  BloggerCourse.com  Fact Sheet for Healthcare Providers:  SeriousBroker.it  This test is no t yet approved or cleared by the [place] FDA and  has been authorized for detection and/or diagnosis of SARS-CoV-2 by FDA under an Emergency Use Authorization (EUA). This EUA will remain  in effect (meaning this test can be used) for the duration of the COVID-19 declaration under Section 564(b)(1) of the Act, 21 U.S.C.section 360bbb-3(b)(1), unless the authorization is terminated  or revoked sooner.       Influenza A by PCR NEGATIVE NEGATIVE Final   Influenza B by PCR NEGATIVE NEGATIVE Final    Comment: (NOTE) The Xpert  Xpress SARS-CoV-2/FLU/RSV plus assay is intended as an aid in the diagnosis of influenza from Nasopharyngeal swab specimens and should not be used as a sole basis for treatment. Nasal washings and aspirates are unacceptable for Xpert Xpress SARS-CoV-2/FLU/RSV testing.  Fact Sheet for Patients: BloggerCourse.com  Fact Sheet for Healthcare Providers: SeriousBroker.it  This test is not yet approved or cleared by the [place] FDA and has been authorized for detection and/or diagnosis of SARS-CoV-2 by FDA under an Emergency Use Authorization (EUA). This EUA will remain in effect (meaning this test can be used) for the duration of the COVID-19 declaration under Section 564(b)(1) of the Act, 21 U.S.C. section 360bbb-3(b)(1), unless the authorization is terminated or revoked.  Performed at [facility], [address] [place], [place] [address]      Radiology Studies: CARDIAC CATHETERIZATION  Result Date: 07/21/2021   Mid LAD lesion is 25% stenosed. 1. Mild nonobstructive CAD. 2. LVEDP 11 Nonischemic cardiomyopathy.     Scheduled Meds:  amiodarone  200 mg Oral BID   apixaban  5 mg Oral BID   Chlorhexidine Gluconate Cloth  6 each Topical Daily   dapagliflozin propanediol  10 mg Oral Daily   digoxin  0.125 mg Oral Daily   folic acid  1 mg Oral Daily   multivitamin with minerals  1 tablet Oral Daily   rosuvastatin  20 mg Oral Daily   sacubitril-valsartan  1 tablet Oral BID   sodium chloride flush  10-40 mL Intracatheter Q12H   sodium chloride flush  3 mL Intravenous Q12H   sodium chloride flush  3 mL Intravenous Q12H   sodium chloride flush  3 mL Intravenous Q12H   spironolactone  25 mg Oral Daily   thiamine  100 mg Oral Daily   Or   thiamine  100 mg Intravenous Daily   Continuous Infusions:  sodium chloride       LOS: 5 days    Time spent:  Zannie Cove, MD [facility]   07/22/2021, 12:01 PM

## 2021-07-22 NOTE — Progress Notes (Addendum)
Patient ID: Christopher Romero, male   DOB: May 21, 1960, 61 y.o.   MRN: [id number] ?  ? ? Advanced Heart Failure Rounding Note ? ?PCP-Cardiologist: Donato Schultz, MD  ? ?Subjective:   ? ?Patient is on milrinone 0.125 today with co-ox 74%.  He is off diuretics, CVP about 8 today.  He remains in NSR.  Stable SBP 120s generally.  ? ?Scr 2.03>1.86>1.7>1.33>1.03 ? ?Na 136>132>127>124.  Patient is drinking a lot of fluid.  ? ?Feeling well. Dyspnea significantly improved. Has been up ambulating in the room. ? ?RHC Procedural Findings: ?Hemodynamics (mmHg) ?RA mean 21 ?RV 46/22 ?PA 47/39, mean 40 ?PCWP mean 30 ?Oxygen saturations: ?PA 62% ?AO 99% ?Cardiac Output (Fick) 4.21  ?Cardiac Index (Fick) 1.68 ?PVR 2.4 WU ? ?Coronary angiography: Mild nonobstructive CAD.  ? ?Objective:   ?Weight Range: ?127.8 kg ?Body mass index is 40.43 kg/m?.  ? ?Vital Signs:   ?Temp:  [97.5 ?F (36.4 ?C)-97.8 ?F (36.6 ?C)] 97.5 ?F (36.4 ?C) (03/07 0422) ?Pulse Rate:  [0-83] 78 (03/07 0422) ?Resp:  [12-26] 14 (03/07 0422) ?BP: (83-131)/(42-115) 125/78 (03/07 0422) ?SpO2:  [89 %-100 %] 93 % (03/07 0422) ?Weight:  [127.8 kg] 127.8 kg (03/07 0422) ?Last BM Date : 07/20/21 ? ?Weight change: ?Filed Weights  ? 07/20/21 0450 07/21/21 0400 07/22/21 0422  ?Weight: 128.9 kg 127.9 kg 127.8 kg  ? ? ?Intake/Output:  ? ?Intake/Output Summary (Last 24 hours) at 07/22/2021 0833 ?Last data filed at 07/22/2021 0539 ?Gross per 24 hour  ?Intake 1443.27 ml  ?Output 2225 ml  ?Net -781.73 ml  ?  ? ? ?Physical Exam  ?  ?General: NAD ?Neck: No JVD, no thyromegaly or thyroid nodule.  ?Lungs: Clear to auscultation bilaterally with normal respiratory effort. ?CV: Nondisplaced PMI.  Heart regular S1/S2, no S3/S4, no murmur.  No peripheral edema.   ?Abdomen: Soft, nontender, no hepatosplenomegaly, no distention.  ?Skin: Intact without lesions or rashes.  ?Neurologic: Alert and oriented x 3.  ?Psych: Normal affect. ?Extremities: No clubbing or cyanosis.  ?HEENT: Normal.  ? ?Telemetry  ? ?SR  70s-80s (personally reviewed) ? ?Labs  ?  ?CBC ?Recent Labs  ?  07/20/21 ?0428 07/22/21 ?0420  ?WBC 7.8 6.8  ?HGB 16.6 17.6*  ?HCT 48.8 51.7  ?MCV 89.4 87.9  ?PLT 253 268  ? ?Basic Metabolic Panel ?Recent Labs  ?  07/21/21 ?0425 07/22/21 ?0420  ?NA 127* 124*  ?K 4.3 4.4  ?CL 87* 92*  ?CO2 31 26  ?GLUCOSE 114* 99  ?BUN 20 15  ?CREATININE 1.33* 1.03  ?CALCIUM 8.8* 8.6*  ? ?Liver Function Tests ?Recent Labs  ?  07/21/21 ?0425 07/22/21 ?0420  ?AST 40 33  ?ALT 94* 74*  ?ALKPHOS 57 51  ?BILITOT 0.9 1.1  ?PROT 6.3* 5.8*  ?ALBUMIN 3.4* 3.2*  ? ?No results for input(s): LIPASE, AMYLASE in the last 72 hours. ?Cardiac Enzymes ?No results for input(s): CKTOTAL, CKMB, CKMBINDEX, TROPONINI in the last 72 hours. ? ?BNP: ?BNP (last 3 results) ?No results for input(s): BNP in the last 8760 hours. ? ?ProBNP (last 3 results) ?No results for input(s): PROBNP in the last 8760 hours. ? ? ?D-Dimer ?No results for input(s): DDIMER in the last 72 hours. ?Hemoglobin A1C ?No results for input(s): HGBA1C in the last 72 hours. ? ?Fasting Lipid Panel ?No results for input(s): CHOL, HDL, LDLCALC, TRIG, CHOLHDL, LDLDIRECT in the last 72 hours. ? ?Thyroid Function Tests ?No results for input(s): TSH, T4TOTAL, T3FREE, THYROIDAB in the last 72 hours. ? ?Invalid input(s): FREET3 ? ? ?  Other results: ? ? ?Imaging  ? ? ?CARDIAC CATHETERIZATION ? ?Result Date: 07/21/2021 ?  Mid LAD lesion is 25% stenosed. 1. Mild nonobstructive CAD. 2. LVEDP 11 Nonischemic cardiomyopathy.   ? ? ?Medications:   ? ? ?Scheduled Medications: ? amiodarone  200 mg Oral BID  ? apixaban  5 mg Oral BID  ? Chlorhexidine Gluconate Cloth  6 each Topical Daily  ? digoxin  0.125 mg Oral Daily  ? folic acid  1 mg Oral Daily  ? multivitamin with minerals  1 tablet Oral Daily  ? rosuvastatin  20 mg Oral Daily  ? sacubitril-valsartan  1 tablet Oral BID  ? sodium chloride flush  10-40 mL Intracatheter Q12H  ? sodium chloride flush  3 mL Intravenous Q12H  ? sodium chloride flush  3 mL  Intravenous Q12H  ? sodium chloride flush  3 mL Intravenous Q12H  ? spironolactone  12.5 mg Oral Daily  ? thiamine  100 mg Oral Daily  ? Or  ? thiamine  100 mg Intravenous Daily  ? ? ?Infusions: ? sodium chloride    ? ? ?PRN Medications: ?sodium chloride, acetaminophen, albuterol, cyclobenzaprine, ondansetron **OR** ondansetron (ZOFRAN) IV, sodium chloride, sodium chloride flush, sodium chloride flush ? ? ? ?Assessment/Plan  ? ?1. Acute on chronic systolic CHF:  Patient has no prior history of cardiac disease. Admitted with several weeks of fatigue, palpitations.  Noted to be in atrial flutter with RVR.  Echo with EF 20-25%, global hypokinesis, severe LV dilation, mildly dilated and moderately dysfunctional RV, moderate-severe functional MR, dilated IVC.  He developed AKI with creatinine rising 0.98 => 2 without aggressive diuresis. RHC 3/3 showed severely elevated right and left heart filling pressures with low cardiac output.  Coronary angiography with mild nonobstructive CAD.  Cause of cardiomyopathy uncertain.  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  Viral myocarditis would also be a consideration.  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  He is now on milrinone 0.125 with co-ox 74%. He has diuresed well, CVP 7-8 today.  ?- Stop milrinone today.  ?- No diuretic today.  ?- Can increase spironolactone to 25 mg daily.  ?- Continue digoxin 0.125 daily.  ?- Continue Entresto 24/26 bid.  ?- Add Farxiga 10 mg daily.  ?2. Atrial flutter: With RVR.  May have been present several weeks prior to admission.  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 Now back in NSR on amiodarone gtt.  ?- Convert amiodarone to po.  ?- Continue apixaban.  ?- Will need outpatient EP evaluation to consider flutter ablation.  ?3. AKI: Suspect cardiorenal in setting of low output HF.  Creatinine improving with diuresis and milrinone.  1.03 today. ?- Maintain cardiac output and MAP.  ?4. Mitral regurgitation: Moderate-severe, suspect  functional.  ?- Reassess MR eventually when in NSR and after full diuresis.  ?5. Hyponatremia: Na 136>132>127>124. CVP not elevated but he has been drinking a lot of fluid.  ?- Fluid restrict today, repeat BMET in pm.  ?  ?Length of Stay: 5 ? ?Marca Ancona, MD  ?07/22/2021, 8:33 AM ? ?Advanced Heart Failure Team ?Pager [phone] (M-F; 7a - 5p)  ?Please contact [facility] for night-coverage after hours (5p -7a ) and weekends on [email or web address] ?  ?

## 2021-07-23 ENCOUNTER — Other Ambulatory Visit (HOSPITAL_COMMUNITY): Payer: Self-pay

## 2021-07-23 DIAGNOSIS — I10 Essential (primary) hypertension: Secondary | ICD-10-CM

## 2021-07-23 DIAGNOSIS — E785 Hyperlipidemia, unspecified: Secondary | ICD-10-CM

## 2021-07-23 DIAGNOSIS — E66812 Obesity, class 2: Secondary | ICD-10-CM | POA: Diagnosis present

## 2021-07-23 DIAGNOSIS — N179 Acute kidney failure, unspecified: Secondary | ICD-10-CM

## 2021-07-23 DIAGNOSIS — E669 Obesity, unspecified: Secondary | ICD-10-CM

## 2021-07-23 DIAGNOSIS — I509 Heart failure, unspecified: Secondary | ICD-10-CM

## 2021-07-23 DIAGNOSIS — Z66 Do not resuscitate: Secondary | ICD-10-CM

## 2021-07-23 DIAGNOSIS — F1010 Alcohol abuse, uncomplicated: Secondary | ICD-10-CM

## 2021-07-23 LAB — BASIC METABOLIC PANEL
Anion gap: 10 (ref 5–15)
BUN: 15 mg/dL (ref 8–23)
CO2: 21 mmol/L — ABNORMAL LOW (ref 22–32)
Calcium: 8.8 mg/dL — ABNORMAL LOW (ref 8.9–10.3)
Chloride: 100 mmol/L (ref 98–111)
Creatinine, Ser: 1.05 mg/dL (ref 0.61–1.24)
GFR, Estimated: >60 mL/min (ref >60)
Glucose, Bld: 87 mg/dL (ref 70–99)
Potassium: 4.8 mmol/L (ref 3.5–5.1)
Sodium: 131 mmol/L — ABNORMAL LOW (ref 135–145)

## 2021-07-23 LAB — COOXEMETRY PANEL
Carboxyhemoglobin: 2.3 % — ABNORMAL HIGH (ref 0.5–1.5)
Methemoglobin: <0.7 % (ref 0.0–1.5)
O2 Saturation: 71 %
Total hemoglobin: 18.5 g/dL — ABNORMAL HIGH (ref 12.0–16.0)

## 2021-07-23 MED ORDER — AMIODARONE HCL 200 MG PO TABS
ORAL_TABLET | ORAL | 0 refills | Status: DC
  Filled 2021-07-23: qty 60, 30d supply, fill #0

## 2021-07-23 MED ORDER — FUROSEMIDE 20 MG PO TABS
20.0000 mg | ORAL_TABLET | Freq: Every day | ORAL | 0 refills | Status: DC
Start: 2021-07-24 — End: 2021-07-29
  Filled 2021-07-23: qty 30, 30d supply, fill #0

## 2021-07-23 MED ORDER — DIGOXIN 125 MCG PO TABS
0.1250 mg | ORAL_TABLET | Freq: Every day | ORAL | 0 refills | Status: DC
  Filled 2021-07-23: qty 30, 30d supply, fill #0

## 2021-07-23 MED ORDER — SACUBITRIL-VALSARTAN 24-26 MG PO TABS
1.0000 | ORAL_TABLET | Freq: Two times a day (BID) | ORAL | 0 refills | Status: DC
  Filled 2021-07-23: qty 60, 30d supply, fill #0

## 2021-07-23 MED ORDER — DAPAGLIFLOZIN PROPANEDIOL 10 MG PO TABS
10.0000 mg | ORAL_TABLET | Freq: Every day | ORAL | 0 refills | Status: DC
Start: 2021-07-24 — End: 2021-08-20
  Filled 2021-07-23: qty 30, 30d supply, fill #0

## 2021-07-23 MED ORDER — SPIRONOLACTONE 25 MG PO TABS
25.0000 mg | ORAL_TABLET | Freq: Every day | ORAL | 0 refills | Status: DC
Start: 2021-07-24 — End: 2021-08-20
  Filled 2021-07-23: qty 30, 30d supply, fill #0

## 2021-07-23 MED ORDER — FUROSEMIDE 20 MG PO TABS: 20.0000 mg | ORAL_TABLET | Freq: Every day | ORAL | Status: DC

## 2021-07-23 MED ORDER — ROSUVASTATIN CALCIUM 20 MG PO TABS
20.0000 mg | ORAL_TABLET | Freq: Every day | ORAL | 0 refills | Status: DC
  Filled 2021-07-23: qty 30, 30d supply, fill #0

## 2021-07-23 MED ORDER — APIXABAN 5 MG PO TABS
5.0000 mg | ORAL_TABLET | Freq: Two times a day (BID) | ORAL | 0 refills | Status: DC
  Filled 2021-07-23: qty 60, 30d supply, fill #0

## 2021-07-23 NOTE — Assessment & Plan Note (Addendum)
Continue statin therapy and follow up as outpatient. ?He was changed to rosuvastatin with good toleration.   ? ?

## 2021-07-23 NOTE — Plan of Care (Signed)
?  Problem: Education: ?Goal: Knowledge of disease or condition will improve ?Outcome: Adequate for Discharge ?Goal: Understanding of medication regimen will improve ?Outcome: Adequate for Discharge ?Goal: Individualized Educational Video(s) ?Outcome: Adequate for Discharge ?  ?Problem: Activity: ?Goal: Ability to tolerate increased activity will improve ?Outcome: Adequate for Discharge ?  ?Problem: Cardiac: ?Goal: Ability to achieve and maintain adequate cardiopulmonary perfusion will improve ?Outcome: Adequate for Discharge ?  ?Problem: Health Behavior/Discharge Planning: ?Goal: Ability to safely manage health-related needs after discharge will improve ?Outcome: Adequate for Discharge ?  ?Problem: Education: ?Goal: Knowledge of General Education information will improve ?Description: Including pain rating scale, medication(s)/side effects and non-pharmacologic comfort measures ?Outcome: Adequate for Discharge ?  ?Problem: Health Behavior/Discharge Planning: ?Goal: Ability to manage health-related needs will improve ?Outcome: Adequate for Discharge ?  ?Problem: Clinical Measurements: ?Goal: Ability to maintain clinical measurements within normal limits will improve ?Outcome: Adequate for Discharge ?Goal: Will remain free from infection ?Outcome: Adequate for Discharge ?Goal: Diagnostic test results will improve ?Outcome: Adequate for Discharge ?Goal: Respiratory complications will improve ?Outcome: Adequate for Discharge ?Goal: Cardiovascular complication will be avoided ?Outcome: Adequate for Discharge ?  ?Problem: Activity: ?Goal: Risk for activity intolerance will decrease ?Outcome: Adequate for Discharge ?  ?Problem: Nutrition: ?Goal: Adequate nutrition will be maintained ?Outcome: Adequate for Discharge ?  ?Problem: Coping: ?Goal: Level of anxiety will decrease ?Outcome: Adequate for Discharge ?  ?Problem: Elimination: ?Goal: Will not experience complications related to bowel motility ?Outcome: Adequate for  Discharge ?Goal: Will not experience complications related to urinary retention ?Outcome: Adequate for Discharge ?  ?Problem: Pain Managment: ?Goal: General experience of comfort will improve ?Outcome: Adequate for Discharge ?  ?Problem: Safety: ?Goal: Ability to remain free from injury will improve ?Outcome: Adequate for Discharge ?  ?Problem: Skin Integrity: ?Goal: Risk for impaired skin integrity will decrease ?Outcome: Adequate for Discharge ?  ?Problem: Education: ?Goal: Ability to demonstrate management of disease process will improve ?Outcome: Adequate for Discharge ?Goal: Ability to verbalize understanding of medication therapies will improve ?Outcome: Adequate for Discharge ?Goal: Individualized Educational Video(s) ?Outcome: Adequate for Discharge ?  ?Problem: Activity: ?Goal: Capacity to carry out activities will improve ?Outcome: Adequate for Discharge ?  ?Problem: Cardiac: ?Goal: Ability to achieve and maintain adequate cardiopulmonary perfusion will improve ?Outcome: Adequate for Discharge ?  ?

## 2021-07-23 NOTE — Discharge Instructions (Signed)

## 2021-07-23 NOTE — Progress Notes (Addendum)
Patient ID: Christopher Romero, male   DOB: Oct 11, 1960, 61 y.o.   MRN: [id number] ?  ? ? Advanced Heart Failure Rounding Note ? ?PCP-Cardiologist: Donato Schultz, MD  ? ?Subjective:   ? ?Co-ox 71% off milrinone.  CVP 5.  Creatinine stable 1.05.  ? ?Na 136>132>127>124>131.  Now fluid restricted.  ? ?Feeling well. Dyspnea significantly improved. Has been up ambulating in the room. ? ?RHC Procedural Findings: ?Hemodynamics (mmHg) ?RA mean 21 ?RV 46/22 ?PA 47/39, mean 40 ?PCWP mean 30 ?Oxygen saturations: ?PA 62% ?AO 99% ?Cardiac Output (Fick) 4.21  ?Cardiac Index (Fick) 1.68 ?PVR 2.4 WU ? ?Coronary angiography: Mild nonobstructive CAD.  ? ?Objective:   ?Weight Range: ?126.1 kg ?Body mass index is 39.87 kg/m?.  ? ?Vital Signs:   ?Temp:  [97.1 ?F (36.2 ?C)-98.2 ?F (36.8 ?C)] 97.7 ?F (36.5 ?C) (03/08 0423) ?Pulse Rate:  [83-86] 83 (03/08 0734) ?Resp:  [13-20] 13 (03/08 0734) ?BP: (97-130)/(66-89) 130/88 (03/08 0734) ?SpO2:  [97 %-100 %] 100 % (03/08 0734) ?Weight:  [126.1 kg] 126.1 kg (03/08 0423) ?Last BM Date : 07/20/21 ? ?Weight change: ?Filed Weights  ? 07/21/21 0400 07/22/21 0422 07/23/21 0423  ?Weight: 127.9 kg 127.8 kg 126.1 kg  ? ? ?Intake/Output:  ? ?Intake/Output Summary (Last 24 hours) at 07/23/2021 0835 ?Last data filed at 07/23/2021 0732 ?Gross per 24 hour  ?Intake 621.54 ml  ?Output 3100 ml  ?Net -2478.46 ml  ?  ? ? ?Physical Exam  ?  ?General: NAD ?Neck: No JVD, no thyromegaly or thyroid nodule.  ?Lungs: Clear to auscultation bilaterally with normal respiratory effort. ?CV: Nondisplaced PMI.  Heart regular S1/S2, no S3/S4, no murmur.  No peripheral edema.   ?Abdomen: Soft, nontender, no hepatosplenomegaly, no distention.  ?Skin: Intact without lesions or rashes.  ?Neurologic: Alert and oriented x 3.  ?Psych: Normal affect. ?Extremities: No clubbing or cyanosis.  ?HEENT: Normal.  ? ?Telemetry  ? ?SR 70s-80s (personally reviewed) ? ?Labs  ?  ?CBC ?Recent Labs  ?  07/22/21 ?0420  ?WBC 6.8  ?HGB 17.6*  ?HCT 51.7  ?MCV 87.9   ?PLT 268  ? ?Basic Metabolic Panel ?Recent Labs  ?  07/22/21 ?1400 07/23/21 ?6811  ?NA 128* 131*  ?K 4.5 4.8  ?CL 94* 100  ?CO2 27 21*  ?GLUCOSE 100* 87  ?BUN 19 15  ?CREATININE 1.27* 1.05  ?CALCIUM 8.8* 8.8*  ? ?Liver Function Tests ?Recent Labs  ?  07/21/21 ?0425 07/22/21 ?0420  ?AST 40 33  ?ALT 94* 74*  ?ALKPHOS 57 51  ?BILITOT 0.9 1.1  ?PROT 6.3* 5.8*  ?ALBUMIN 3.4* 3.2*  ? ?No results for input(s): LIPASE, AMYLASE in the last 72 hours. ?Cardiac Enzymes ?No results for input(s): CKTOTAL, CKMB, CKMBINDEX, TROPONINI in the last 72 hours. ? ?BNP: ?BNP (last 3 results) ?No results for input(s): BNP in the last 8760 hours. ? ?ProBNP (last 3 results) ?No results for input(s): PROBNP in the last 8760 hours. ? ? ?D-Dimer ?No results for input(s): DDIMER in the last 72 hours. ?Hemoglobin A1C ?No results for input(s): HGBA1C in the last 72 hours. ? ?Fasting Lipid Panel ?No results for input(s): CHOL, HDL, LDLCALC, TRIG, CHOLHDL, LDLDIRECT in the last 72 hours. ? ?Thyroid Function Tests ?No results for input(s): TSH, T4TOTAL, T3FREE, THYROIDAB in the last 72 hours. ? ?Invalid input(s): FREET3 ? ? ?Other results: ? ? ?Imaging  ? ? ?No results found. ? ? ?Medications:   ? ? ?Scheduled Medications: ? amiodarone  200 mg Oral BID  ?  apixaban  5 mg Oral BID  ? Chlorhexidine Gluconate Cloth  6 each Topical Daily  ? dapagliflozin propanediol  10 mg Oral Daily  ? digoxin  0.125 mg Oral Daily  ? folic acid  1 mg Oral Daily  ? [START ON 07/24/2021] furosemide  20 mg Oral Daily  ? multivitamin with minerals  1 tablet Oral Daily  ? rosuvastatin  20 mg Oral Daily  ? sacubitril-valsartan  1 tablet Oral BID  ? sodium chloride flush  10-40 mL Intracatheter Q12H  ? sodium chloride flush  3 mL Intravenous Q12H  ? sodium chloride flush  3 mL Intravenous Q12H  ? sodium chloride flush  3 mL Intravenous Q12H  ? spironolactone  25 mg Oral Daily  ? thiamine  100 mg Oral Daily  ? Or  ? thiamine  100 mg Intravenous Daily  ? ? ?Infusions: ? sodium  chloride    ? ? ?PRN Medications: ?sodium chloride, acetaminophen, albuterol, cyclobenzaprine, ondansetron **OR** ondansetron (ZOFRAN) IV, sodium chloride, sodium chloride flush, sodium chloride flush ? ? ? ?Assessment/Plan  ? ?1. Acute on chronic systolic CHF:  Patient has no prior history of cardiac disease. Admitted with several weeks of fatigue, palpitations.  Noted to be in atrial flutter with RVR.  Echo with EF 20-25%, global hypokinesis, severe LV dilation, mildly dilated and moderately dysfunctional RV, moderate-severe functional MR, dilated IVC.  He developed AKI with creatinine rising 0.98 => 2 without aggressive diuresis. RHC 3/3 showed severely elevated right and left heart filling pressures with low cardiac output.  Coronary angiography with mild nonobstructive CAD.  Cause of cardiomyopathy uncertain.  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  Viral myocarditis would also be a consideration.  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  He is now off milrinone with co-ox 71%. He has diuresed well, CVP 5 today.  ?- Start Lasix 20 mg daily tomorrow.  ?- Continue spironolactone 25 mg daily.  ?- Continue digoxin 0.125 daily.  ?- Continue Entresto 24/26 bid.  ?- Continue Farxiga 10 mg daily.  ?2. Atrial flutter: With RVR.  May have been present several weeks prior to admission.  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 Now back in NSR on amiodarone gtt.  ?- Continue po amiodarone.  ?- Continue apixaban.  ?- Will need outpatient EP evaluation to consider flutter ablation.  ?3. AKI: Suspect cardiorenal in setting of low output HF.  Creatinine improving with diuresis and milrinone.  1.05 today. ?- Maintain cardiac output and MAP.  ?4. Mitral regurgitation: Moderate-severe, suspect functional.  ?- Reassess MR eventually when in NSR and after full diuresis.  ?5. Hyponatremia: Na 136>132>127>124>131, improved with fluid restriction.  ? ?Disposition: Patient can go home today.  Needs close followup in CHF  clinic, can stay out of work until followup.  Needs EP appt to discuss atrial flutter ablation.  Meds for home: Lasix 20 mg po daily, spironolactone 25 daily, dapagliflozin 10 daily, digoxin 0.125 daily, Entresto 24/26 bid, amiodarone 200 mg bid x 10 days then 200 mg daily, apixaban 5 mg bid, Crestor 20 daily. ?  ?Length of Stay: 6 ? ?Marca Ancona, MD  ?07/23/2021, 8:35 AM ? ?Advanced Heart Failure Team ?Pager [phone] (M-F; 7a - 5p)  ?Please contact [facility] for night-coverage after hours (5p -7a ) and weekends on [email or web address] ?  ?

## 2021-07-23 NOTE — Discharge Summary (Addendum)
Physician Discharge Summary   Patient: Christopher Romero MRN: [id number] DOB: [birth year]  Admit date:     07/17/2021  Discharge date: 07/23/21  Discharge Physician: Coralie Keens   PCP: Jerrye Bushy, FNP   Recommendations at discharge:    Patient has been placed on heart failure regimen with good toleration. Not on B blocker due to hypotension, possible consideration as outpatient, currently on digoxin and amiodarone for atrial flutter. Consider referral to EP for outpatient atrial flutter ablation. Continue anticoagulation with apixaban.   Discharge Diagnoses: Principal Problem:   New onset atrial flutter (HCC) Active Problems:   New onset of congestive heart failure (HCC)   AKI (acute kidney injury) (HCC)   Dyslipidemia   Hypertension   Alcohol abuse   DNR (do not resuscitate)   Class 2 obesity  Resolved Problems:   * No resolved hospital problems. [facility] Course: Christopher Romero was admitted to the hospital with the working diagnosis of decompensated heart failure in the setting of atrial flutter with rapid ventricular response.  61 yo male with the past medical history of hypertension, obesity and dyslipidemia, who presented with palpitations. Patient reported not feeling well for a few weeks, he run out of his medications in mid January. He was evaluated by his primary care provider and was found to have elevated heart rate and blood pressure, along with elevated liver enzymes and was referred to the ED. On hs initial physical examination his blood pressure was 101/82, HR 150, rr 13 and 02 saturation 94%. Heart S1 and S2 present irregularly irregular, lungs with expiratory wheezing bilaterally, abdomen soft and no lower extremity edema.   Na 133, K 4,0, CL 98, bicarbonate 25, glucose 118, bun 28 cr 0.98 AST 71 ALT 188 total bilirubin 0.7 High sensitive troponin 74 and 93  Wbc 8,6, hgb 15,8 Hct 48.9 plt 335  Sars covid 19 negative  Toxicology negative   Chest  radiograph with cardiomegaly, bilateral hilar vascular congestion, small right pleural effusion.   EKG 151 bpm, normal axis, qtc 507, SVT (flutter), with no significant ST segment or T wave changes.   Follow up EKG with atrial flutter.   Patient underwent diuresis with furosemide.  Further work up with echocardiogram showed reduced LV systolic function.  Placed on amiodarone for rate control.   03/03 cardiac catheterization with low cardiac output and placed on milrinone for inotropic support.   Clinically improved, inotropic support discontinued, patient will follow up as outpatient.     Assessment and Plan: * New onset atrial flutter [facility]) Patient was admitted to the cardiac unit and placed on a continuous telemetry monitoring.  Initially placed on metoprolol but had hypotension.  He was placed on amiodarone with improvement in heart rate and hemodynamics.  He converted to sinus rhythm.   Patient will continue taking amiodarone for rate and rhythm control and anticoagulation with apixaban.  Outpatient evaluation for possible ablation.  Follow QTc as outpatient, it was prolonged on admission.   New onset of congestive heart failure (HCC) Patient was placed on IV furosemide for diuresis, negative fluid balance was achieved, -19,444 ml, with significant improvement of his symptoms.   Further work up with echocardiogram showed, LV EF 20 to 25% with global hypokinesis, severe cavity dilatation, RV systolic function moderately reduced, RSVP 38 mmHg, moderate to sever mitral regurgitation.   Cardiac catheterization with elevated right and left filling pressures, biventricular failure, mild pulmonary venous hypertension with low cardiac output.  Mild non obstructive coronary artery  disease.   Patient was placed on guideline directed therapy with entresto, empagliflozin, spironolactone with good toleration. Continue with digoxin and amiodarone.  Holding on B blockade for now.  Plan to  follow up as outpatient.     AKI (acute kidney injury) (HCC) Hyponatremia and hyperkalemia.  Peak serum cr at 2,0. Clinically improved with diuresis.   Electrolytes were corrected. Patient is tolerating po and diuretic therapy has been transitioned to po with good toleration.   At the time of his discharge his serum cr is 1.0 with K at 4,8 and Na 131.  Plan to follow up as outpatient with renal function and electrolytes.   Prolonged QT interval -Will attempt to avoid QT-prolonging medications such as PPI, nausea meds, SSRIs -Repeat EKG in AM  Dyslipidemia Continue statin therapy and follow up as outpatient. He was changed to rosuvastatin with good toleration.     Hypertension Continue blood pressure control with Entresto and spironolactone. His discharge blood pressure has been stable at 114 to 130 mmHg systolic.   Alcohol abuse Elevated liver enzymes. Patient with no alcohol withdrawal symptoms during his hospitalization. Advices about avoidance of alcohol. Follow up LFT as outpatient.   At his discharge AST 33 and ALT 74  Wheezing -Significant tobacco history -Will order Albuterol HFA -Likely needs outpatient COPD evaluation  DNR (do not resuscitate) Code status addressed with patient.   Class 2 obesity Calculated BMI is 39,7 Will need close follow up as outpatient.          Consultants: cardiology  Procedures performed: cardiac catheterization   Disposition: Home Diet recommendation:  Discharge Diet Orders (From admission, onward)     Start     Ordered   07/23/21 0000  Diet - low sodium heart healthy        07/23/21 1021           Cardiac diet DISCHARGE MEDICATION: Allergies as of 07/23/2021   No Known Allergies      Medication List     STOP taking these medications    IBUPROFEN PM PO   lisinopril-hydrochlorothiazide 20-12.5 MG tablet Commonly known as: ZESTORETIC   metoprolol succinate 25 MG 24 hr tablet Commonly known as:  TOPROL-XL   simvastatin 40 MG tablet Commonly known as: ZOCOR       TAKE these medications    acetaminophen 500 MG tablet Commonly known as: TYLENOL Take 1,000 mg by mouth every 6 (six) hours as needed for mild pain.   amiodarone 200 MG tablet Commonly known as: PACERONE Take 1 tablet twice daily for 10 days, then on March 19, start taking 1 tablet daily.   apixaban 5 MG Tabs tablet Commonly known as: ELIQUIS Take 1 tablet (5 mg total) by mouth 2 (two) times daily.   dapagliflozin propanediol 10 MG Tabs tablet Commonly known as: FARXIGA Take 1 tablet (10 mg total) by mouth daily. Start taking on: July 24, 2021   digoxin 0.125 MG tablet Commonly known as: LANOXIN Take 1 tablet (0.125 mg total) by mouth daily. Start taking on: July 24, 2021   furosemide 20 MG tablet Commonly known as: LASIX Take 1 tablet (20 mg total) by mouth daily. Start taking on: July 24, 2021   rosuvastatin 20 MG tablet Commonly known as: CRESTOR Take 1 tablet (20 mg total) by mouth daily. Start taking on: July 24, 2021   sacubitril-valsartan 24-26 MG Commonly known as: ENTRESTO Take 1 tablet by mouth 2 (two) times daily.   spironolactone 25 MG tablet Commonly  known as: ALDACTONE Take 1 tablet (25 mg total) by mouth daily. Start taking on: July 24, 2021        Follow-up Information     Jake Bathe, MD .   Specialty: Cardiology Contact information: [address] [place] [address] [phone]         Jerrye Bushy, FNP Follow up.   Specialty: Family Medicine Why: please contact PCP to schedule follow up appt Contact information: [address] [place] [address] [phone]         [facility] Follow up on 07/29/2021.   Specialty: Cardiology Why: at 3:00. Located on the 1st floot at [facility]. Entrance C. Free valet parking. Contact information: [address] [id number] Wilhemina Bonito  [place] [address] [phone]               Discharge Exam: Filed Weights   07/21/21 0400 07/22/21 0422 07/23/21 0423  Weight: 127.9 kg 127.8 kg 126.1 kg   BP 130/88 (BP Location: Right Arm)    Pulse 83    Temp 97.7 F (36.5 C) (Oral)    Resp 13    Ht 5\' 10"  (1.778 m)    Wt 126.1 kg    SpO2 100%    BMI 39.87 kg/m   Patient with no dyspnea or lower extremity edema, no palpitations or chest pain.   Neurology awake and alert ENT with no pallor Cardiovascular with S1 and S2 present and rhythmic, no gallops, rubs. Positive systolic murmur at the apex 2-3/6 No JVD No lower extremity edema. Respiratory with no wheezing or rales, no rhonchi Abdomen soft and non tender, protuberant   Condition at discharge: stable  The results of significant diagnostics from this hospitalization (including imaging, microbiology, ancillary and laboratory) are listed below for reference.   Imaging Studies: CARDIAC CATHETERIZATION  Result Date: 07/21/2021   Mid LAD lesion is 25% stenosed. 1. Mild nonobstructive CAD. 2. LVEDP 11 Nonischemic cardiomyopathy.   CARDIAC CATHETERIZATION  Result Date: 07/18/2021 1. Elevated right and left heart filling pressures, biventricular failure. 2. Mild pulmonary venous hypertension. 3. Low cardiac output.   DG Chest Port 1 View  Result Date: 07/17/2021 CLINICAL DATA:  61 year old male with tachycardia, palpitations, some shortness of breath. EXAM: PORTABLE CHEST 1 VIEW COMPARISON:  None. FINDINGS: Portable AP upright view at 1044 hours. Mild cardiomegaly. Other mediastinal contours are within normal limits. Visualized tracheal air column is within normal limits. No definite pleural effusion. Allowing for portable technique the lungs are clear. No pneumothorax. No acute osseous abnormality identified. IMPRESSION: Borderline to mild cardiomegaly. No acute cardiopulmonary abnormality. Electronically Signed   By: 77 M.D.   On: 07/17/2021 10:54   ECHOCARDIOGRAM  COMPLETE  Result Date: 07/17/2021    ECHOCARDIOGRAM REPORT   Patient Name:   Jovon Browe Date of Exam: 07/17/2021 Medical Rec #:  09/16/2021     Height: Accession #:    [id number]    Weight: Date of Birth:  [birth year]    BSA: Patient Age:    60 years      BP:           103/80 mmHg Patient Gender: M             HR:           79 bpm. Exam Location:  Inpatient Procedure: 2D Echo, Color Doppler, Cardiac Doppler and Intracardiac  Opacification Agent Indications:    Afib  History:        Patient has no prior history of Echocardiogram examinations.                 Risk Factors:Hypertension and Dyslipidemia.  Sonographer:    Cleatis Polka Referring Phys: [id number] JENNIFER YATES IMPRESSIONS  1. No apical thrombus with Definity contrast. Left ventricular ejection fraction, by estimation, is 20 to 25%. Left ventricular ejection fraction by 2D MOD biplane is 24.2 %. The left ventricle has severely decreased function. The left ventricle demonstrates global hypokinesis. The left ventricular internal cavity size was severely dilated. Left ventricular diastolic function could not be evaluated.  2. Right ventricular systolic function is moderately reduced. The right ventricular size is mildly enlarged. There is normal pulmonary artery systolic pressure. The estimated right ventricular systolic pressure is 33.8 mmHg.  3. Left atrial size was mildly dilated.  4. The mitral valve is grossly normal. Moderate to severe mitral valve regurgitation.  5. The aortic valve is tricuspid. Aortic valve regurgitation is not visualized.  6. The inferior vena cava is dilated in size with <50% respiratory variability, suggesting right atrial pressure of 15 mmHg. Comparison(s): No prior Echocardiogram. FINDINGS  Left Ventricle: No apical thrombus with Definity contrast. Left ventricular ejection fraction, by estimation, is 20 to 25%. Left ventricular ejection fraction by 2D MOD biplane is 24.2 %. The left ventricle has severely decreased  function. The left ventricle demonstrates global hypokinesis. The left ventricular internal cavity size was severely dilated. There is no left ventricular hypertrophy. Left ventricular diastolic function could not be evaluated due to atrial fibrillation. Left ventricular diastolic function could not be evaluated. Right Ventricle: The right ventricular size is mildly enlarged. No increase in right ventricular wall thickness. Right ventricular systolic function is moderately reduced. There is normal pulmonary artery systolic pressure. The tricuspid regurgitant velocity is 2.17 m/s, and with an assumed right atrial pressure of 15 mmHg, the estimated right ventricular systolic pressure is 33.8 mmHg. Left Atrium: Left atrial size was mildly dilated. Right Atrium: Right atrial size was normal in size. Pericardium: There is no evidence of pericardial effusion. Mitral Valve: The mitral valve is grossly normal. Moderate to severe mitral valve regurgitation, with posteriorly-directed jet. Tricuspid Valve: The tricuspid valve is grossly normal. Tricuspid valve regurgitation is mild. Aortic Valve: The aortic valve is tricuspid. Aortic valve regurgitation is not visualized. Aortic valve peak gradient measures 3.4 mmHg. Pulmonic Valve: The pulmonic valve was normal in structure. Pulmonic valve regurgitation is not visualized. Aorta: The aortic root and ascending aorta are structurally normal, with no evidence of dilitation. Venous: The inferior vena cava is dilated in size with less than 50% respiratory variability, suggesting right atrial pressure of 15 mmHg. IAS/Shunts: No atrial level shunt detected by color flow Doppler.  LEFT VENTRICLE PLAX 2D                        Biplane EF (MOD) LVIDd:         6.70 cm         LV Biplane EF:   Left LVIDs:         5.50 cm                          ventricular LV PW:         1.10 cm  ejection LV IVS:        1.00 cm                          fraction by LVOT diam:      2.20 cm                          2D MOD LV SV:         42                               biplane is LVOT Area:     3.80 cm                         24.2 %.                                 Diastology LV Volumes (MOD)               LV e' medial:    6.85 cm/s LV vol d, MOD    216.0 ml      LV E/e' medial:  14.9 A2C:                           LV e' lateral:   8.92 cm/s LV vol d, MOD    210.0 ml      LV E/e' lateral: 11.4 A4C: LV vol s, MOD    160.0 ml A2C: LV vol s, MOD    160.0 ml A4C: LV SV MOD A2C:   56.0 ml LV SV MOD A4C:   210.0 ml LV SV MOD BP:    52.1 ml RIGHT VENTRICLE            IVC RV Basal diam:  4.20 cm    IVC diam: 2.90 cm RV Mid diam:    2.90 cm RV S prime:     5.66 cm/s TAPSE (M-mode): 1.4 cm LEFT ATRIUM             RIGHT ATRIUM LA diam:        4.80 cm RA Area:     24.20 cm LA Vol (A2C):   62.8 ml RA Volume:   75.30 ml LA Vol (A4C):   76.3 ml LA Biplane Vol: 69.4 ml  AORTIC VALVE AV Area (Vmax): 2.71 cm AV Vmax:        92.60 cm/s AV Peak Grad:   3.4 mmHg LVOT Vmax:      65.90 cm/s LVOT Vmean:     49.900 cm/s LVOT VTI:       0.110 m  AORTA Ao Root diam: 3.60 cm Ao Asc diam:  3.10 cm MITRAL VALVE                TRICUSPID VALVE MV Area (PHT): 5.93 cm     TR Peak grad:   18.8 mmHg MV Decel Time: 128 msec     TR Vmax:        217.00 cm/s MR Peak grad: 57.0 mmHg MR Mean grad: 42.0 mmHg     SHUNTS MR Vmax:      377.50 cm/s   Systemic VTI:  0.11 m MR Vmean:     314.0 cm/s    Systemic Diam:  2.20 cm MV E velocity: 102.00 cm/s Zoila Shutter MD Electronically signed by Zoila Shutter MD Signature Date/Time: 07/17/2021/3:16:56 PM    Final    [place] EKG SITE RITE  Result Date: 07/18/2021 If Site Rite image not attached, placement could not be confirmed due to current cardiac rhythm.  [place] EKG SITE RITE  Result Date: 07/18/2021 If Site Rite image not attached, placement could not be confirmed due to current cardiac rhythm.   Microbiology: Results for orders placed or performed during the hospital encounter of 07/17/21   Resp Panel by RT-PCR (Flu A&B, Covid) Nasopharyngeal Swab     Status: None   Collection Time: 07/17/21 10:23 AM   Specimen: Nasopharyngeal Swab; Nasopharyngeal(NP) swabs in vial transport medium  Result Value Ref Range Status   SARS Coronavirus 2 by RT PCR NEGATIVE NEGATIVE Final    Comment: (NOTE) SARS-CoV-2 target nucleic acids are NOT DETECTED.  The SARS-CoV-2 RNA is generally detectable in upper respiratory specimens during the acute phase of infection. The lowest concentration of SARS-CoV-2 viral copies this assay can detect is 138 copies/mL. A negative result does not preclude SARS-Cov-2 infection and should not be used as the sole basis for treatment or other patient management decisions. A negative result may occur with  improper specimen collection/handling, submission of specimen other than nasopharyngeal swab, presence of viral mutation(s) within the areas targeted by this assay, and inadequate number of viral copies(<138 copies/mL). A negative result must be combined with clinical observations, patient history, and epidemiological information. The expected result is Negative.  Fact Sheet for Patients:  BloggerCourse.com  Fact Sheet for Healthcare Providers:  SeriousBroker.it  This test is no t yet approved or cleared by the [place] FDA and  has been authorized for detection and/or diagnosis of SARS-CoV-2 by FDA under an Emergency Use Authorization (EUA). This EUA will remain  in effect (meaning this test can be used) for the duration of the COVID-19 declaration under Section 564(b)(1) of the Act, 21 U.S.C.section 360bbb-3(b)(1), unless the authorization is terminated  or revoked sooner.       Influenza A by PCR NEGATIVE NEGATIVE Final   Influenza B by PCR NEGATIVE NEGATIVE Final    Comment: (NOTE) The Xpert Xpress SARS-CoV-2/FLU/RSV plus assay is intended as an aid in the diagnosis of influenza from  Nasopharyngeal swab specimens and should not be used as a sole basis for treatment. Nasal washings and aspirates are unacceptable for Xpert Xpress SARS-CoV-2/FLU/RSV testing.  Fact Sheet for Patients: BloggerCourse.com  Fact Sheet for Healthcare Providers: SeriousBroker.it  This test is not yet approved or cleared by the [place] FDA and has been authorized for detection and/or diagnosis of SARS-CoV-2 by FDA under an Emergency Use Authorization (EUA). This EUA will remain in effect (meaning this test can be used) for the duration of the COVID-19 declaration under Section 564(b)(1) of the Act, 21 U.S.C. section 360bbb-3(b)(1), unless the authorization is terminated or revoked.  Performed at [facility], [address] [place], [place] [address]     Labs: CBC: Recent Labs  Lab 07/17/21 1028 07/18/21 6010 07/18/21 1117 07/18/21 1118 07/20/21 0428 07/22/21 0420  WBC 8.6 9.5  --   --  7.8 6.8  NEUTROABS 5.9  --   --   --   --   --   HGB 15.8 15.7 16.3 16.0 16.6 17.6*  HCT 48.9 48.1 48.0 47.0 48.8 51.7  MCV 94.0 92.7  --   --  89.4 87.9  PLT 335 294  --   --  253 268   Basic Metabolic Panel: Recent Labs  Lab 07/18/21 1309 07/19/21 0415 07/20/21 0428 07/21/21 0425 07/22/21 0420 07/22/21 1400 07/23/21 0435  NA 130*   < > 132* 127* 124* 128* 131*  K 4.3   < > 3.7 4.3 4.4 4.5 4.8  CL 95*   < > 85* 87* 92* 94* 100  CO2 21*   < > 36* 31 26 27  21*  GLUCOSE 122*   < > 110* 114* 99 100* 87  BUN 44*   < > 25* 20 15 19 15   CREATININE 1.86*   < > 1.36* 1.33* 1.03 1.27* 1.05  CALCIUM 8.6*   < > 8.8* 8.8* 8.6* 8.8* 8.8*  MG 2.2  --   --   --   --   --   --    < > = values in this interval not displayed.   Liver Function Tests: Recent Labs  Lab 07/18/21 07/19/21 0415 07/20/21 0428 07/21/21 0425 07/22/21 0420  AST 93* 61* 44* 40 33  ALT 191* 156* 121* 94* 74*  ALKPHOS 56 54 55 57 51  BILITOT 1.2 0.5  0.6 0.9 1.1  PROT 5.7* 6.3* 6.2* 6.3* 5.8*  ALBUMIN 3.3* 3.4* 3.4* 3.4* 3.2*   CBG: Recent Labs  Lab 07/17/21 2221  GLUCAP 173*    Discharge time spent: greater than 30 minutes.  Signed: 09/16/21, MD [facility] 07/23/2021

## 2021-07-23 NOTE — Assessment & Plan Note (Signed)
Calculated BMI is 39,7 ?Will need close follow up as outpatient.  ?

## 2021-07-23 NOTE — TOC Transition Note (Addendum)
Transition of Care (TOC) - CM/SW Discharge Note ? ? ?Patient Details  ?Name: Christopher Romero ?MRN: [id number] ?Date of Birth: 08/06/1960 ? ?Transition of Care (TOC) CM/SW Contact:  ?Zenon Mayo, RN ?Phone Number: ?07/23/2021, 10:28 AM ? ? ?Clinical Narrative:    ?Patient is for dc today, TOC to fill his medications and bring up to room.  NCM gave patient the 10.00 co pay eliquis coupon. Informed him he can use that coupon once deductible has been met. He state his daughter will be transporting him home today. Will need Picc to be dc'd prior to dc. ? ? ?  ?Barriers to Discharge: Continued Medical Work up ? ? ?Patient Goals and CMS Choice ?Patient states their goals for this hospitalization and ongoing recovery are:: wants to get back to get better ?CMS Medicare.gov Compare Post Acute Care list provided to:: Patient ?  ? ?Discharge Placement ?  ?           ?  ?  ?  ?  ? ?Discharge Plan and Services ?  ?Discharge Planning Services: CM Consult ?           ?  ?  ?  ?  ?  ?  ?  ?  ?  ?  ? ?Social Determinants of Health (SDOH) Interventions ?  ? ? ?Readmission Risk Interventions ?No flowsheet data found. ? ? ? ? ?

## 2021-07-23 NOTE — Progress Notes (Signed)
CARDIAC REHAB PHASE I  ? ?PRE:  Rate/Rhythm: 86 SR ? ?  BP: sitting 117/85 ? ?  SaO2: 97 RA ? ?MODE:  Ambulation: 50 ft  ? ?POST:  Rate/Rhythm: 93 SR ? ?No problems walking short distance however IV team came to d/c PICC. Discussed with pt HF management through HF booklet. Receptive. He will cut down on ETOH. Quit smoking. Declined afib book. ?[phone] ? ?Deona Novitski Ethelda Chick CES, ACSM ?07/23/2021 ?11:33 AM ? ? ? ? ?

## 2021-07-23 NOTE — Assessment & Plan Note (Signed)
Hyponatremia and hyperkalemia.  ?Peak serum cr at 2,0. ?Clinically improved with diuresis.  ? ?Electrolytes were corrected. Patient is tolerating po and diuretic therapy has been transitioned to po with good toleration.  ? ?At the time of his discharge his serum cr is 1.0 with K at 4,8 and Na 131.  ?Plan to follow up as outpatient with renal function and electrolytes.  ?

## 2021-07-23 NOTE — Hospital Course (Addendum)
Christopher Romero was admitted to the hospital with the working diagnosis of decompensated heart failure in the setting of atrial flutter with rapid ventricular response. ? ?61 yo male with the past medical history of hypertension, obesity and dyslipidemia, who presented with palpitations. Patient reported not feeling well for a few weeks, he run out of his medications in mid January. He was evaluated by his primary care provider and was found to have elevated heart rate and blood pressure, along with elevated liver enzymes and was referred to the ED. On hs initial physical examination his blood pressure was 101/82, HR 150, rr 13 and 02 saturation 94%. Heart S1 and S2 present irregularly irregular, lungs with expiratory wheezing bilaterally, abdomen soft and no lower extremity edema.  ? ?Na 133, K 4,0, CL 98, bicarbonate 25, glucose 118, bun 28 cr 0.98 ?AST 71 ALT 188 total bilirubin 0.7 ?High sensitive troponin 74 and 93  ?Wbc 8,6, hgb 15,8 ?Hct 48.9 plt 335  ?Sars covid 19 negative  ?Toxicology negative  ? ?Chest radiograph with cardiomegaly, bilateral hilar vascular congestion, small right pleural effusion.  ? ?EKG 151 bpm, normal axis, qtc 507, SVT (flutter), with no significant ST segment or T wave changes.  ? ?Follow up EKG with atrial flutter.  ? ?Patient underwent diuresis with furosemide.  ?Further work up with echocardiogram showed reduced LV systolic function.  ?Placed on amiodarone for rate control.  ? ?03/03 cardiac catheterization with low cardiac output and placed on milrinone for inotropic support.  ? ?Clinically improved, inotropic support discontinued, patient will follow up as outpatient.  ? ? ?

## 2021-07-23 NOTE — Assessment & Plan Note (Signed)
Continue blood pressure control with Entresto and spironolactone. ?His discharge blood pressure has been stable at 114 to 130 mmHg systolic.  ?

## 2021-07-28 NOTE — Progress Notes (Incomplete)
ADVANCED HF CLINIC CONSULT NOTE   Primary Care: Madison Hickman, FNP HF Cardiologist: Dr. Aundra Dubin  HPI: 61 y/o male w/ PMH of HTN, HLD and obesity, no prior cardiac history, who presented to ED w/ compalints of several weeks of persistent and worsening generalized fatigue/ malaise and elevated HR. Found to be in Atrial Flutter w/ RVR, of unknown duration as well as acute CHF. EKG showed 2:1 flutter, rate 150 bpm. Initially placed on cardizem gtt for rate control. 2D echo showed severe biventricular dysfunction, LVEF 20-25% w/ global HK, RV moderately reduced, mod-severe MR. No apical thrombus w/ definity contrast. Cardizem gtt subsequently discontinued and placed on metoprolol but decompensated w/ drop in BP and bump in SCr, rasing concern for developing cardigenic shock. Metoprolol discontinued. Transitioned to amio gtt and placed on milrinone 0.25. He is scheduled for RHC today. AHF team consulted to assist w/ further management.   RHC Procedural Findings: Hemodynamics (mmHg) RA mean 21 RV 46/22 PA 47/39, mean 40 PCWP mean 30 Oxygen saturations: PA 62% AO 99% Cardiac Output (Fick) 4.21  Cardiac Index (Fick) 1.68 PVR 2.4 WU   Coronary angiography: Mild nonobstructive CAD.        Review of Systems: [y] = yes, [ ]  = no   General: Weight gain [ ] ; Weight loss [ ] ; Anorexia [ ] ; Fatigue [ ] ; Fever [ ] ; Chills [ ] ; Weakness [ ]   Cardiac: Chest pain/pressure [ ] ; Resting SOB [ ] ; Exertional SOB [ ] ; Orthopnea [ ] ; Pedal Edema [ ] ; Palpitations [ ] ; Syncope [ ] ; Presyncope [ ] ; Paroxysmal nocturnal dyspnea[ ]   Pulmonary: Cough [ ] ; Wheezing[ ] ; Hemoptysis[ ] ; Sputum [ ] ; Snoring [ ]   GI: Vomiting[ ] ; Dysphagia[ ] ; Melena[ ] ; Hematochezia [ ] ; Heartburn[ ] ; Abdominal pain [ ] ; Constipation [ ] ; Diarrhea [ ] ; BRBPR [ ]   GU: Hematuria[ ] ; Dysuria [ ] ; Nocturia[ ]   Vascular: Pain in legs with walking [ ] ; Pain in feet with lying flat [ ] ; Non-healing sores [ ] ; Stroke [ ] ; TIA [ ] ; Slurred  speech [ ] ;  Neuro: Headaches[ ] ; Vertigo[ ] ; Seizures[ ] ; Paresthesias[ ] ;Blurred vision [ ] ; Diplopia [ ] ; Vision changes [ ]   Ortho/Skin: Arthritis [ ] ; Joint pain [ ] ; Muscle pain [ ] ; Joint swelling [ ] ; Back Pain [ ] ; Rash [ ]   Psych: Depression[ ] ; Anxiety[ ]   Heme: Bleeding problems [ ] ; Clotting disorders [ ] ; Anemia [ ]   Endocrine: Diabetes [ ] ; Thyroid dysfunction[ ]    Past Medical History:  Diagnosis Date   Dyslipidemia    Hypertension    Visual impairment    L is severely impaired, R is about 20/30    Current Outpatient Medications  Medication Sig Dispense Refill   acetaminophen (TYLENOL) 500 MG tablet Take 1,000 mg by mouth every 6 (six) hours as needed for mild pain.     amiodarone (PACERONE) 200 MG tablet Take 1 tablet twice daily for 10 days, then on March 19, start taking 1 tablet daily. 60 tablet 0   apixaban (ELIQUIS) 5 MG TABS tablet Take 1 tablet (5 mg total) by mouth 2 (two) times daily. 60 tablet 0   dapagliflozin propanediol (FARXIGA) 10 MG TABS tablet Take 1 tablet (10 mg total) by mouth daily. 30 tablet 0   digoxin (LANOXIN) 0.125 MG tablet Take 1 tablet (0.125 mg total) by mouth daily. 30 tablet 0   furosemide (LASIX) 20 MG tablet Take 1 tablet (20 mg total) by mouth daily.  30 tablet 0   rosuvastatin (CRESTOR) 20 MG tablet Take 1 tablet (20 mg total) by mouth daily. 30 tablet 0   sacubitril-valsartan (ENTRESTO) 24-26 MG Take 1 tablet by mouth 2 (two) times daily. 60 tablet 0   spironolactone (ALDACTONE) 25 MG tablet Take 1 tablet (25 mg total) by mouth daily. 30 tablet 0   No current facility-administered medications for this visit.    No Known Allergies    Social History   Socioeconomic History   Marital status: Single    Spouse name: Not on file   Number of children: Not on file   Years of education: Not on file   Highest education level: Not on file  Occupational History   Occupation: foam Patent examiner  Tobacco Use   Smoking status:  Former    Packs/day: 2.00    Years: 40.00    Pack years: 80.00    Types: Cigarettes   Smokeless tobacco: Never  Substance and Sexual Activity   Alcohol use: Yes    Comment: 2-12 daily, denies having a problem with ETOH   Drug use: Never   Sexual activity: Not on file  Other Topics Concern   Not on file  Social History Narrative   Not on file   Social Determinants of Health   Financial Resource Strain: Not on file  Food Insecurity: Not on file  Transportation Needs: Not on file  Physical Activity: Not on file  Stress: Not on file  Social Connections: Not on file  Intimate Partner Violence: Not on file      Family History  Problem Relation Age of Onset   Dementia Mother    Parkinsonism Mother    Cancer Father    CAD Father 41   Hypertension Sister    CAD Sister    Hypertension Brother    CAD Other    Heart failure Other     There were no vitals filed for this visit.  PHYSICAL EXAM: General:  Well appearing. No respiratory difficulty HEENT: normal Neck: supple. no JVD. Carotids 2+ bilat; no bruits. No lymphadenopathy or thryomegaly appreciated. Cor: PMI nondisplaced. Regular rate & rhythm. No rubs, gallops or murmurs. Lungs: clear Abdomen: soft, nontender, nondistended. No hepatosplenomegaly. No bruits or masses. Good bowel sounds. Extremities: no cyanosis, clubbing, rash, edema Neuro: alert & oriented x 3, cranial nerves grossly intact. moves all 4 extremities w/o difficulty. Affect pleasant.  ECG:   ASSESSMENT & PLAN: 1. Acute on chronic systolic CHF:  Patient has no prior history of cardiac disease. Admitted with several weeks of fatigue, palpitations.  Noted to be in atrial flutter with RVR.  Echo with EF 20-25%, global hypokinesis, severe LV dilation, mildly dilated and moderately dysfunctional RV, moderate-severe functional MR, dilated IVC.  He developed AKI with creatinine rising 0.98 => 2 without aggressive diuresis. RHC 3/3 showed severely elevated right  and left heart filling pressures with low cardiac output.  Coronary angiography with mild nonobstructive CAD.  Cause of cardiomyopathy uncertain.  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  Viral myocarditis would also be a consideration.  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  He is now off milrinone with co-ox 71%. He has diuresed well, CVP 5 today.  - Start Lasix 20 mg daily tomorrow.  - Continue spironolactone 25 mg daily.  - Continue digoxin 0.125 daily.  - Continue Entresto 24/26 bid.  - Continue Farxiga 10 mg daily.  2. Atrial flutter: With RVR.  May have been  present several weeks prior to admission.  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 Now back in NSR on amiodarone gtt.  - Continue po amiodarone.  - Continue apixaban.  - Will need outpatient EP evaluation to consider flutter ablation.  3. AKI: Suspect cardiorenal in setting of low output HF.  Creatinine improving with diuresis and milrinone.  1.05 today. - Maintain cardiac output and MAP.  4. Mitral regurgitation: Moderate-severe, suspect functional.  - Reassess MR eventually when in NSR and after full diuresis.  5. Hyponatremia: Na 136>132>127>124>131, improved with fluid restriction.    Disposition: Patient can go home today.  Needs close followup in CHF clinic, can stay out of work until followup.  Needs EP appt to discuss atrial flutter ablation.  Meds for home: Lasix 20 mg po daily, spironolactone 25 daily, dapagliflozin 10 daily, digoxin 0.125 daily, Entresto 24/26 bid, amiodarone 200 mg bid x 10 days then 200 mg daily, apixaban 5 mg bid, Crestor 20 daily.

## 2021-07-29 ENCOUNTER — Encounter (HOSPITAL_COMMUNITY): Payer: Self-pay

## 2021-07-29 ENCOUNTER — Ambulatory Visit (HOSPITAL_COMMUNITY)
Admit: 2021-07-29 | Discharge: 2021-07-29 | Disposition: A | Discharge status: Home / Self Care | Payer: Managed Care, Other (non HMO) | Attending: Family Medicine | Admitting: Family Medicine

## 2021-07-29 ENCOUNTER — Other Ambulatory Visit: Payer: Self-pay

## 2021-07-29 VITALS — BP 150/80 | HR 87 | Wt 280.6 lb

## 2021-07-29 DIAGNOSIS — I340 Nonrheumatic mitral (valve) insufficiency: Secondary | ICD-10-CM | POA: Diagnosis not present

## 2021-07-29 DIAGNOSIS — E669 Obesity, unspecified: Secondary | ICD-10-CM | POA: Diagnosis not present

## 2021-07-29 DIAGNOSIS — Z6841 Body Mass Index (BMI) 40.0 and over, adult: Secondary | ICD-10-CM | POA: Insufficient documentation

## 2021-07-29 DIAGNOSIS — I440 Atrioventricular block, first degree: Secondary | ICD-10-CM | POA: Insufficient documentation

## 2021-07-29 DIAGNOSIS — I5022 Chronic systolic (congestive) heart failure: Secondary | ICD-10-CM | POA: Insufficient documentation

## 2021-07-29 DIAGNOSIS — N179 Acute kidney failure, unspecified: Secondary | ICD-10-CM | POA: Insufficient documentation

## 2021-07-29 DIAGNOSIS — I2510 Atherosclerotic heart disease of native coronary artery without angina pectoris: Secondary | ICD-10-CM | POA: Diagnosis not present

## 2021-07-29 DIAGNOSIS — I110 Hypertensive heart disease with heart failure: Secondary | ICD-10-CM | POA: Insufficient documentation

## 2021-07-29 DIAGNOSIS — E785 Hyperlipidemia, unspecified: Secondary | ICD-10-CM | POA: Diagnosis not present

## 2021-07-29 DIAGNOSIS — R9431 Abnormal electrocardiogram [ECG] [EKG]: Secondary | ICD-10-CM | POA: Insufficient documentation

## 2021-07-29 DIAGNOSIS — Z8249 Family history of ischemic heart disease and other diseases of the circulatory system: Secondary | ICD-10-CM | POA: Insufficient documentation

## 2021-07-29 DIAGNOSIS — R7989 Other specified abnormal findings of blood chemistry: Secondary | ICD-10-CM

## 2021-07-29 DIAGNOSIS — I482 Chronic atrial fibrillation, unspecified: Secondary | ICD-10-CM | POA: Insufficient documentation

## 2021-07-29 DIAGNOSIS — I4892 Unspecified atrial flutter: Secondary | ICD-10-CM

## 2021-07-29 DIAGNOSIS — Z7901 Long term (current) use of anticoagulants: Secondary | ICD-10-CM | POA: Insufficient documentation

## 2021-07-29 DIAGNOSIS — Z7984 Long term (current) use of oral hypoglycemic drugs: Secondary | ICD-10-CM | POA: Diagnosis not present

## 2021-07-29 DIAGNOSIS — R0683 Snoring: Secondary | ICD-10-CM | POA: Insufficient documentation

## 2021-07-29 DIAGNOSIS — I5021 Acute systolic (congestive) heart failure: Secondary | ICD-10-CM | POA: Diagnosis not present

## 2021-07-29 DIAGNOSIS — Z79899 Other long term (current) drug therapy: Secondary | ICD-10-CM | POA: Insufficient documentation

## 2021-07-29 DIAGNOSIS — I272 Pulmonary hypertension, unspecified: Secondary | ICD-10-CM | POA: Diagnosis not present

## 2021-07-29 LAB — CBC
HCT: 54.5 % — ABNORMAL HIGH (ref 39.0–52.0)
Hemoglobin: 18.1 g/dL — ABNORMAL HIGH (ref 13.0–17.0)
MCH: 30.1 pg (ref 26.0–34.0)
MCHC: 33.2 g/dL (ref 30.0–36.0)
MCV: 90.5 fL (ref 80.0–100.0)
Platelets: 328 10*3/uL (ref 150–400)
RBC: 6.02 MIL/uL — ABNORMAL HIGH (ref 4.22–5.81)
RDW: 13.2 % (ref 11.5–15.5)
WBC: 10.3 10*3/uL (ref 4.0–10.5)
nRBC: 0 % (ref 0.0–0.2)

## 2021-07-29 LAB — BASIC METABOLIC PANEL
Anion gap: 8 (ref 5–15)
BUN: 12 mg/dL (ref 8–23)
CO2: 24 mmol/L (ref 22–32)
Calcium: 9.3 mg/dL (ref 8.9–10.3)
Chloride: 98 mmol/L (ref 98–111)
Creatinine, Ser: 0.96 mg/dL (ref 0.61–1.24)
GFR, Estimated: >60 mL/min (ref >60)
Glucose, Bld: 92 mg/dL (ref 70–99)
Potassium: 4.7 mmol/L (ref 3.5–5.1)
Sodium: 130 mmol/L — ABNORMAL LOW (ref 135–145)

## 2021-07-29 LAB — DIGOXIN LEVEL: Digoxin Level: 0.5 ng/mL — ABNORMAL LOW (ref 0.8–2.0)

## 2021-07-29 MED ORDER — FUROSEMIDE 20 MG PO TABS
20.0000 mg | ORAL_TABLET | ORAL | 3 refills | Status: DC | PRN
Start: 2021-07-29 — End: 2021-08-20

## 2021-07-29 MED ORDER — ENTRESTO 49-51 MG PO TABS: 1.0000 | ORAL_TABLET | Freq: Two times a day (BID) | ORAL | 4 refills | Status: DC

## 2021-07-29 NOTE — Patient Instructions (Addendum)
Thank you for coming in today ? ?Labs were done today, if any labs are abnormal the clinic will call you ? ?INCREASE Entresto 49/51 mg 1 tablet twice daily  ?Ok to double up on your 24/26 until you run out  ? ?CHANGE Lasix to 20 mg daily For weight gain of 3 lbs in 24 hours or 5 lbs in a week ? ?Your physician recommends that you schedule a follow-up appointment in:  ?3 weeks in clinic  ?12 weeks with Dr. Haroldine Laws with echocardiogram ? ?Your physician has requested that you have an echocardiogram. Echocardiography is a painless test that uses sound waves to create images of your heart. It provides your doctor with information about the size and shape of your heart and how well your heart?s chambers and valves are working. This procedure takes approximately one hour. There are no restrictions for this procedure. ?  ? ?At the [facility], you and your health needs are our priority. As part of our continuing mission to provide you with exceptional heart care, we have created designated Provider Care Teams. These Care Teams include your primary Cardiologist (physician) and Advanced Practice Providers (APPs- Physician Assistants and Nurse Practitioners) who all work together to provide you with the care you need, when you need it.  ? ?You may see any of the following providers on your designated Care Team at your next follow up: ?Dr Glori Bickers ?Dr Loralie Champagne ?Darrick Grinder, NP ?Lyda Jester, PA ?Jessica Milford,NP ?Marlyce Huge, PA ?Audry Riles, PharmD ? ? ?Please be sure to bring in all your medications bottles to every appointment.  ? ?If you have any questions or concerns before your next appointment please send [place] a message through [place] or call our office at [phone].   ? ?TO LEAVE A MESSAGE FOR THE NURSE SELECT OPTION 2, PLEASE LEAVE A MESSAGE INCLUDING: ?YOUR NAME ?DATE OF BIRTH ?CALL BACK NUMBER ?REASON FOR CALL**this is important as we prioritize the call backs ? ?YOU WILL  RECEIVE A CALL BACK THE SAME DAY AS LONG AS YOU CALL BEFORE 4:00 PM ? ?

## 2021-07-30 ENCOUNTER — Other Ambulatory Visit (HOSPITAL_COMMUNITY): Payer: Self-pay

## 2021-07-30 ENCOUNTER — Telehealth (HOSPITAL_COMMUNITY): Payer: Self-pay

## 2021-07-30 NOTE — Telephone Encounter (Signed)
Transitions of Care Pharmacy  ? ?Call attempted for a pharmacy transitions of care follow-up. HIPAA appropriate voicemail was left with call back information provided.  ? ?Call attempt #1. Will follow-up in 2-3 days.  ? ?Jiles Crocker, PharmD ?Clinical Pharmacist ?[facility] ?07/30/2021 2:14 PM  ?

## 2021-08-04 ENCOUNTER — Other Ambulatory Visit (HOSPITAL_COMMUNITY): Payer: Self-pay

## 2021-08-04 ENCOUNTER — Telehealth (HOSPITAL_COMMUNITY): Payer: Self-pay

## 2021-08-04 NOTE — Telephone Encounter (Signed)
Pharmacy Transitions of Care Follow-up Telephone Call ? ?Date of discharge: 07/23/21  ?Discharge Diagnosis: A flutter ? ?How have you been since you were released from the hospital? Patient wanted to know what pain killers were safe to take with his blood thinner. Counseled patient to stick to tylenol and to avoid NSAIDs like Alleve and Motrin  ? ?Medication changes made at discharge: ?     START taking: ?amiodarone (PACERONE)  ?digoxin (LANOXIN)  ?Eliquis (apixaban)  ?Farxiga (dapagliflozin propanediol)  ?rosuvastatin (CRESTOR)  ?spironolactone (ALDACTONE)  ?STOP taking: ?IBUPROFEN PM PO  ?lisinopril-hydrochlorothiazide 20-12.5 MG tablet (ZESTORETIC)  ?metoprolol succinate 25 MG 24 hr tablet (TOPROL-XL)  ?simvastatin 40 MG tablet (ZOCOR) ? ?Medication changes verified by the patient? Yes ?  ? ?Medication Accessibility: ? ?Home Pharmacy: Not discussed  ? ?Was the patient provided with refills on discharged medications? No  ? ?Have all prescriptions been transferred from [facility] to home pharmacy? N/A  ? ?Is the patient able to afford medications? Has insurance ?  ? ?Medication Review: ? ?APIXABAN (ELIQUIS)  ?Apixaban 5 mg BID initiated on 07/23/21.  ?- Discussed importance of taking medication around the same time everyday  ?- Advised patient of medications to avoid (NSAIDs, ASA)  ?- Educated that Tylenol (acetaminophen) will be the preferred analgesic to prevent risk of bleeding  ?- Emphasized importance of monitoring for signs and symptoms of bleeding (abnormal bruising, prolonged bleeding, nose bleeds, bleeding from gums, discolored urine, black tarry stools)  ?- Advised patient to alert all providers of anticoagulation therapy prior to starting a new medication or having a procedure  ? ? ?Follow-up Appointments: ? ?[facility] f/u appt confirmed? Seen by Dr. Sherri Sear on 07/29/21 @ Cardiology.  ? ?If their condition worsens, is the pt aware to call PCP or go to the Emergency Dept.? Yes ? ?Final Patient  Assessment: ?Patient has f/u scheduled and refills at home pharmacy ? ?

## 2021-08-11 ENCOUNTER — Other Ambulatory Visit: Payer: Self-pay

## 2021-08-11 ENCOUNTER — Ambulatory Visit (HOSPITAL_COMMUNITY)
Admission: RE | Admit: 2021-08-11 | Discharge: 2021-08-11 | Disposition: A | Discharge status: Home / Self Care | Payer: Managed Care, Other (non HMO) | Source: Ambulatory Visit | Attending: Cardiology | Admitting: Cardiology

## 2021-08-11 DIAGNOSIS — I5021 Acute systolic (congestive) heart failure: Secondary | ICD-10-CM

## 2021-08-18 NOTE — Progress Notes (Signed)
? ?ADVANCED HF CLINIC NOTE ? ? ?Primary Care: Madison Hickman, FNP ?HF Cardiologist: Dr. Aundra Dubin ? ?HPI: ?Christopher Romero is a 61 y.o. male w/ PMH of HTN, HLD and obesity, and new diagnosis of AFL and systolic heart failure. ? ?Admitted 3/23 with AFL with RVR and new systolic heart failure. Echo showed severe biventricular dysfunction, LVEF 20-25% w/ global HK, RV moderately reduced, mod-severe MR. No apical thrombus w/ definity contrast. Started on amiodarone gtt. Concern for low output, started on milrinone and continued IV diuresis. R/LHC showed  mild non obstructive CAD, elevated right and left filling pressures, mild pulmonary venous hypertension and low cardiac output. He was placed on digoxin and GDMT titrated. 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 Converted to NSR, amiodarone changed to po and milrinone weaned off. Advised EP follow up to discuss ablation. Discharged home, weight 277 lbs. ? ?Today he returns for HF follow up. Overall feeling fine. He has been walking a mile a day without dyspnea. Denies palpitations, abnormal bleeding, CP, dizziness, edema, or PND/Orthopnea. Appetite ok. No fever or chills. Weight at home 272 pounds. Taking all medications. No further ETOH use since hospitalization. ? ?ECG (personally reviewed): none ordered today. ? ?Labs (3/23): K 4.8, creatinine 1.05 ? ?Cardiac Studies: ?- R/LHC (3/23): mild non obstructive CAD. ?mean 21 ?RV 46/22 ?PA 47/39, mean 40 ?PCWP mean 30 ?Oxygen saturations: ?PA 62% ?AO 99% ?Cardiac Output (Fick) 4.21  ?Cardiac Index (Fick) 1.68 ?PVR 2.4 WU ? ?- Echo (3/23): EF 20-25%, global hypokinesis, severe LV dilation, mildly dilated and moderately dysfunctional RV, moderate-severe functional MR, dilated IVC ? ?Past Medical History:  ?Diagnosis Date  ? Dyslipidemia   ? Hypertension   ? Visual impairment   ? L is severely impaired, R is about 20/30  ? ?Current Outpatient Medications  ?Medication Sig Dispense Refill  ? acetaminophen (TYLENOL) 500  MG tablet Take 1,000 mg by mouth every 6 (six) hours as needed for mild pain.    ? amiodarone (PACERONE) 200 MG tablet Take 200 mg by mouth daily.    ? apixaban (ELIQUIS) 5 MG TABS tablet Take 1 tablet (5 mg total) by mouth 2 (two) times daily. 60 tablet 0  ? dapagliflozin propanediol (FARXIGA) 10 MG TABS tablet Take 1 tablet (10 mg total) by mouth daily. 30 tablet 0  ? digoxin (LANOXIN) 0.125 MG tablet Take 1 tablet (0.125 mg total) by mouth daily. 30 tablet 0  ? furosemide (LASIX) 20 MG tablet Take 1 tablet (20 mg total) by mouth as needed for fluid or edema. 1 tablet daily as needed for weight gain of 3 lbs in 24 hour or 5 lbs in a week 30 tablet 3  ? rosuvastatin (CRESTOR) 20 MG tablet Take 1 tablet (20 mg total) by mouth daily. 30 tablet 0  ? sacubitril-valsartan (ENTRESTO) 49-51 MG Take 1 tablet by mouth 2 (two) times daily. 60 tablet 4  ? spironolactone (ALDACTONE) 25 MG tablet Take 1 tablet (25 mg total) by mouth daily. 30 tablet 0  ? ?No current facility-administered medications for this encounter.  ? ?No Known Allergies ? ?Social History  ? ?Socioeconomic History  ? Marital status: Single  ?  Spouse name: Not on file  ? Number of children: Not on file  ? Years of education: Not on file  ? Highest education level: Not on file  ?Occupational History  ? Occupation: foam Patent examiner  ?Tobacco Use  ? Smoking status: Former  ?  Packs/day: 2.00  ?  Years: 40.00  ?  Pack years: 80.00  ?  Types: Cigarettes  ? Smokeless tobacco: Never  ?Substance and Sexual Activity  ? Alcohol use: Yes  ?  Comment: 2-12 daily, denies having a problem with ETOH  ? Drug use: Never  ? Sexual activity: Not on file  ?Other Topics Concern  ? Not on file  ?Social History Narrative  ? Not on file  ? ?Social Determinants of Health  ? ?Financial Resource Strain: Not on file  ?Food Insecurity: Not on file  ?Transportation Needs: Not on file  ?Physical Activity: Not on file  ?Stress: Not on file  ?Social Connections: Not on file   ?Intimate Partner Violence: Not on file  ? ?Family History  ?Problem Relation Age of Onset  ? Dementia Mother   ? Parkinsonism Mother   ? Cancer Father   ? CAD Father 50  ? Hypertension Sister   ? CAD Sister   ? Hypertension Brother   ? CAD Other   ? Heart failure Other   ? ?BP (!) 144/90   Pulse 82   Wt 124.2 kg (273 lb 12.8 oz)   SpO2 97%   BMI 39.29 kg/m?  ? ?Wt Readings from Last 3 Encounters:  ?08/19/21 124.2 kg (273 lb 12.8 oz)  ?07/29/21 127.3 kg (280 lb 9.6 oz)  ?07/23/21 126.1 kg (277 lb 14.4 oz)  ? ?PHYSICAL EXAM: ?General:  NAD. No resp difficulty ?HEENT: Normal ?Neck: Supple. No JVD. Carotids 2+ bilat; no bruits. No lymphadenopathy or thryomegaly appreciated. ?Cor: PMI nondisplaced. Regular rate & rhythm. No rubs, gallops or murmurs. ?Lungs: Clear ?Abdomen: Obese, nontender, nondistended. No hepatosplenomegaly. No bruits or masses. Good bowel sounds. ?Extremities: No cyanosis, clubbing, rash, edema ?Neuro: Alert & oriented x 3, cranial nerves grossly intact. Moves all 4 extremities w/o difficulty. Affect pleasant. ? ?ASSESSMENT & PLAN: ?1. Chronic systolic CHF:  Patient has no prior history of cardiac disease. Admitted with several weeks of fatigue, palpitations.  Noted to be in atrial flutter with RVR.  Echo with EF 20-25%, global hypokinesis, severe LV dilation, mildly dilated and moderately dysfunctional RV, moderate-severe functional MR, dilated IVC.  He developed AKI with creatinine rising 0.98 => 2 without aggressive diuresis. RHC 3/3 showed severely elevated right and left heart filling pressures with low cardiac output.  Coronary angiography with mild nonobstructive CAD.  Cause of cardiomyopathy uncertain.  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  Viral myocarditis would also be a consideration.  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  Stable NYHA II. He is not volume overloaded on exam.  ?- Start carvedilol 3.125 mg bid. ?- Increase Entresto to 97/103 mg bid. BMET  today, repeat in 10-14 days. ?- Continue Lasix 20 mg PRN weight gain/edema. ?- Continue spironolactone 25 mg daily.  ?- Continue digoxin 0.125 daily. Dig level 0.5 on 07/29/21. ?- Continue Farxiga 10 mg daily.  ?- Repeat echo next appt. ?2. Atrial flutter: With RVR.  May have been present several weeks prior to admission.  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 Regular on exam today. ?- Continue amiodarone 200 mg daily.  ?- Continue apixaban. No bleeding issues. ?- He has been referred to EP evaluation to consider flutter ablation, has appt soon.  ?3. Mitral regurgitation: Moderate-severe, suspect functional.  ?- Reassess MR eventually when in NSR and after full diuresis.  ?4. H/o Hyponatremia: Continue to restrict FW. Check BMET today.  ?5. Snoring: We discussed sleep study, he does not want to wear CPAP or other device  if he were to have OSA. ? ?RTW: He has STD through 09/01/21. OK to go back to work starting 09/08/21 [employer] forward[employer]). Given a note for work today, noting he needs allowances for rest breaks and/or days off as needed. ? ?Follow up in 2 months with Dr. Aundra Dubin + echo. ? ?Allena Katz, FNP-BC ?08/19/21 ? ?

## 2021-08-19 ENCOUNTER — Ambulatory Visit (HOSPITAL_COMMUNITY)
Admission: RE | Admit: 2021-08-19 | Discharge: 2021-08-19 | Disposition: A | Discharge status: Home / Self Care | Payer: Managed Care, Other (non HMO) | Source: Ambulatory Visit | Attending: Family Medicine | Admitting: Family Medicine

## 2021-08-19 ENCOUNTER — Encounter (HOSPITAL_COMMUNITY): Payer: Self-pay

## 2021-08-19 VITALS — BP 144/90 | HR 82 | Wt 273.8 lb

## 2021-08-19 DIAGNOSIS — Z7984 Long term (current) use of oral hypoglycemic drugs: Secondary | ICD-10-CM | POA: Diagnosis not present

## 2021-08-19 DIAGNOSIS — Z79899 Other long term (current) drug therapy: Secondary | ICD-10-CM | POA: Diagnosis not present

## 2021-08-19 DIAGNOSIS — I2510 Atherosclerotic heart disease of native coronary artery without angina pectoris: Secondary | ICD-10-CM | POA: Insufficient documentation

## 2021-08-19 DIAGNOSIS — E871 Hypo-osmolality and hyponatremia: Secondary | ICD-10-CM | POA: Diagnosis not present

## 2021-08-19 DIAGNOSIS — Z7901 Long term (current) use of anticoagulants: Secondary | ICD-10-CM | POA: Insufficient documentation

## 2021-08-19 DIAGNOSIS — R0683 Snoring: Secondary | ICD-10-CM | POA: Insufficient documentation

## 2021-08-19 DIAGNOSIS — I110 Hypertensive heart disease with heart failure: Secondary | ICD-10-CM | POA: Diagnosis present

## 2021-08-19 DIAGNOSIS — E785 Hyperlipidemia, unspecified: Secondary | ICD-10-CM | POA: Insufficient documentation

## 2021-08-19 DIAGNOSIS — I4892 Unspecified atrial flutter: Secondary | ICD-10-CM | POA: Insufficient documentation

## 2021-08-19 DIAGNOSIS — I272 Pulmonary hypertension, unspecified: Secondary | ICD-10-CM | POA: Insufficient documentation

## 2021-08-19 DIAGNOSIS — I340 Nonrheumatic mitral (valve) insufficiency: Secondary | ICD-10-CM | POA: Insufficient documentation

## 2021-08-19 DIAGNOSIS — Z6839 Body mass index (BMI) 39.0-39.9, adult: Secondary | ICD-10-CM | POA: Diagnosis not present

## 2021-08-19 DIAGNOSIS — I5022 Chronic systolic (congestive) heart failure: Secondary | ICD-10-CM | POA: Insufficient documentation

## 2021-08-19 DIAGNOSIS — E669 Obesity, unspecified: Secondary | ICD-10-CM | POA: Diagnosis not present

## 2021-08-19 LAB — BASIC METABOLIC PANEL
Anion gap: 7 (ref 5–15)
BUN: 13 mg/dL (ref 8–23)
CO2: 25 mmol/L (ref 22–32)
Calcium: 9.7 mg/dL (ref 8.9–10.3)
Chloride: 105 mmol/L (ref 98–111)
Creatinine, Ser: 1.03 mg/dL (ref 0.61–1.24)
GFR, Estimated: >60 mL/min (ref >60)
Glucose, Bld: 96 mg/dL (ref 70–99)
Potassium: 4.4 mmol/L (ref 3.5–5.1)
Sodium: 137 mmol/L (ref 135–145)

## 2021-08-19 MED ORDER — ENTRESTO 97-103 MG PO TABS: 1.0000 | ORAL_TABLET | Freq: Two times a day (BID) | ORAL | 6 refills | Status: DC

## 2021-08-19 MED ORDER — CARVEDILOL 3.125 MG PO TABS: 3.1250 mg | ORAL_TABLET | Freq: Two times a day (BID) | ORAL | 3 refills | Status: DC

## 2021-08-19 NOTE — Patient Instructions (Signed)
Medication Changes: ? ?Increase Entresto to 97/103 mg Twice daily ? ?Start Carvedilol 3.125 mg Twice daily  ? ?Lab Work: ? ?Labs done today, your results will be available in MyChart, we will contact you for abnormal readings. ? ?Your physician recommends that you return for lab work in: 1-2 weeks, this can be done at your appointment on 09/01/21 ? ?Testing/Procedures: ? ?None ? ?Referrals: ? ?None ? ?Special Instructions // Education: ? ?Do the following things EVERYDAY: ?Weigh yourself in the morning before breakfast. Write it down and keep it in a log. ?Take your medicines as prescribed ?Eat low salt foods--Limit salt (sodium) to 2000 mg per day.  ?Stay as active as you can everyday ?Limit all fluids for the day to less than 2 liters ? ?We have provided you a return to work note for 09/08/21, please let us know if you need anything else ? ?Follow-Up in: AS SCHEDULED 10/21/21 ? ?At the [facility], you and your health needs are our priority. We have a designated team specialized in the treatment of Heart Failure. This Care Team includes your primary Heart Failure Specialized Cardiologist (physician), Advanced Practice Providers (APPs- Physician Assistants and Nurse Practitioners), and Pharmacist who all work together to provide you with the care you need, when you need it.  ? ?You may see any of the following providers on your designated Care Team at your next follow up: ? ?Dr Glori Bickers ?Dr Loralie Champagne ?Darrick Grinder, NP ?Lyda Jester, PA ?Jessica Milford,NP ?Marlyce Huge, PA ?Audry Riles, PharmD ? ? ?Please be sure to bring in all your medications bottles to every appointment.  ? ?Need to Contact us: ? ?If you have any questions or concerns before your next appointment please send [place] a message through [place] or call our office at [phone].   ? ?TO LEAVE A MESSAGE FOR THE NURSE SELECT OPTION 2, PLEASE LEAVE A MESSAGE INCLUDING: ?YOUR NAME ?DATE OF BIRTH ?CALL BACK NUMBER ?REASON FOR  CALL**this is important as we prioritize the call backs ? ?YOU WILL RECEIVE A CALL BACK THE SAME DAY AS LONG AS YOU CALL BEFORE 4:00 PM ? ? ?

## 2021-08-20 ENCOUNTER — Telehealth (HOSPITAL_COMMUNITY): Payer: Self-pay | Admitting: *Deleted

## 2021-08-20 ENCOUNTER — Other Ambulatory Visit (HOSPITAL_COMMUNITY): Payer: Self-pay | Admitting: *Deleted

## 2021-08-20 MED ORDER — DAPAGLIFLOZIN PROPANEDIOL 10 MG PO TABS: 10.0000 mg | ORAL_TABLET | Freq: Every day | ORAL | 0 refills | Status: AC

## 2021-08-20 MED ORDER — ROSUVASTATIN CALCIUM 20 MG PO TABS: 20.0000 mg | ORAL_TABLET | Freq: Every day | ORAL | 0 refills | Status: DC

## 2021-08-20 MED ORDER — FUROSEMIDE 20 MG PO TABS
20.0000 mg | ORAL_TABLET | ORAL | 3 refills | Status: AC | PRN
Start: 2021-08-20 — End: 2022-10-22

## 2021-08-20 MED ORDER — DIGOXIN 125 MCG PO TABS: 0.1250 mg | ORAL_TABLET | Freq: Every day | ORAL | 0 refills | Status: DC

## 2021-08-20 MED ORDER — APIXABAN 5 MG PO TABS
5.0000 mg | ORAL_TABLET | Freq: Two times a day (BID) | ORAL | 3 refills | Status: DC
Start: 2021-08-20 — End: 2021-11-21

## 2021-08-20 MED ORDER — SPIRONOLACTONE 25 MG PO TABS: 25.0000 mg | ORAL_TABLET | Freq: Every day | ORAL | 0 refills | Status: DC

## 2021-08-20 NOTE — Telephone Encounter (Signed)
[employer] called checking on status of disability paperwork.  ? ?Routed to [employer]  ?

## 2021-08-27 NOTE — Telephone Encounter (Signed)
Forms completed, signed by Dr Shirlee Latch, and faxed to [employer] at [phone], copy mailed to pt for his records ?

## 2021-09-01 ENCOUNTER — Encounter: Payer: Self-pay | Admitting: *Deleted

## 2021-09-01 ENCOUNTER — Encounter: Payer: Self-pay | Admitting: Cardiology

## 2021-09-01 ENCOUNTER — Ambulatory Visit: Payer: Managed Care, Other (non HMO) | Admitting: Cardiology

## 2021-09-01 ENCOUNTER — Other Ambulatory Visit: Payer: Self-pay

## 2021-09-01 VITALS — BP 118/80 | HR 73 | Ht 70.0 in | Wt 272.0 lb

## 2021-09-01 DIAGNOSIS — Z01812 Encounter for preprocedural laboratory examination: Secondary | ICD-10-CM | POA: Diagnosis not present

## 2021-09-01 DIAGNOSIS — I5022 Chronic systolic (congestive) heart failure: Secondary | ICD-10-CM

## 2021-09-01 DIAGNOSIS — I483 Typical atrial flutter: Secondary | ICD-10-CM | POA: Diagnosis not present

## 2021-09-01 NOTE — Patient Instructions (Addendum)
Medication Instructions:  ?Your physician recommends that you continue on your current medications as directed. Please refer to the Current Medication list given to you today. ? ?*If you need a refill on your cardiac medications before your next appointment, please call your pharmacy* ? ? ?Lab Work: ?Pre procedure labs -- see procedure instruction letter:  BMP & CBC ? ?If you have labs (blood work) drawn today and your tests are completely normal, you will receive your results only by: ?MyChart Message (if you have MyChart) OR ?A paper copy in the mail ?If you have any lab test that is abnormal or we need to change your treatment, we will call you to review the results. ? ? ?Testing/Procedures: ?Your physician has recommended that you have an ablation. Catheter ablation is a medical procedure used to treat some cardiac arrhythmias (irregular heartbeats). During catheter ablation, a long, thin, flexible tube is put into a blood vessel in your groin (upper thigh), or neck. This tube is called an ablation catheter. It is then guided to your heart through the blood vessel. Radio frequency waves destroy small areas of heart tissue where abnormal heartbeats may cause an arrhythmia to start. Please follow instruction letter given to you today. ? ? ?Follow-Up: ?At [facility], you and your health needs are our priority.  As part of our continuing mission to provide you with exceptional heart care, we have created designated Provider Care Teams.  These Care Teams include your primary Cardiologist (physician) and Advanced Practice Providers (APPs -  Physician Assistants and Nurse Practitioners) who all work together to provide you with the care you need, when you need it. ? ?Your next appointment:   ?1 month(s) after your ablation ? ?The format for your next appointment:   ?In Person ? ?Provider:   ?Will Camnitz ? ? ?Thank you for choosing [facility]!! ? ? ?Trinidad Curet, RN ?[phone] ? ? ?Other  Instructions ? ?Cardiac Ablation ?Cardiac ablation is a procedure to destroy (ablate) some heart tissue that is sending bad signals. These bad signals cause problems in heart rhythm. ?The heart has many areas that make these signals. If there are problems in these areas, they can make the heart beat in a way that is not normal. Destroying some tissues can help make the heart rhythm normal. ?Tell your doctor about: ?Any allergies you have. ?All medicines you are taking. These include vitamins, herbs, eye drops, creams, and over-the-counter medicines. ?Any problems you or family members have had with medicines that make you fall asleep (anesthetics). ?Any blood disorders you have. ?Any surgeries you have had. ?Any medical conditions you have, such as kidney failure. ?Whether you are pregnant or may be pregnant. ?What are the risks? ?This is a safe procedure. But problems may occur, including: ?Infection. ?Bruising and bleeding. ?Bleeding into the chest. ?Stroke or blood clots. ?Damage to nearby areas of your body. ?Allergies to medicines or dyes. ?The need for a pacemaker if the normal system is damaged. ?Failure of the procedure to treat the problem. ?What happens before the procedure? ?Medicines ?Ask your doctor about: ?Changing or stopping your normal medicines. This is important. ?Taking aspirin and ibuprofen. Do not take these medicines unless your doctor tells you to take them. ?Taking other medicines, vitamins, herbs, and supplements. ?General instructions ?Follow instructions from your doctor about what you cannot eat or drink. ?Plan to have someone take you home from the hospital or clinic. ?If you will be going home right after the procedure, plan  to have someone with you for 24 hours. ?Ask your doctor what steps will be taken to prevent infection. ?What happens during the procedure? ? ?An IV tube will be put into one of your veins. ?You will be given a medicine to help you relax. ?The skin on your neck or  groin will be numbed. ?A cut (incision) will be made in your neck or groin. A needle will be put through your cut and into a large vein. ?A tube (catheter) will be put into the needle. The tube will be moved to your heart. ?Dye may be put through the tube. This helps your doctor see your heart. ?Small devices (electrodes) on the tube will send out signals. ?A type of energy will be used to destroy some heart tissue. ?The tube will be taken out. ?Pressure will be held on your cut. This helps stop bleeding. ?A bandage will be put over your cut. ?The exact procedure may vary among doctors and hospitals. ?What happens after the procedure? ?You will be watched until you leave the hospital or clinic. This includes checking your heart rate, breathing rate, oxygen, and blood pressure. ?Your cut will be watched for bleeding. You will need to lie still for a few hours. ?Do not drive for 24 hours or as long as your doctor tells you. ?Summary ?Cardiac ablation is a procedure to destroy some heart tissue. This is done to treat heart rhythm problems. ?Tell your doctor about any medical conditions you may have. Tell him or her about all medicines you are taking to treat them. ?This is a safe procedure. But problems may occur. These include infection, bruising, bleeding, and damage to nearby areas of your body. ?Follow what your doctor tells you about food and drink. You may also be told to change or stop some of your medicines. ?After the procedure, do not drive for 24 hours or as long as your doctor tells you. ?This information is not intended to replace advice given to you by your health care provider. Make sure you discuss any questions you have with your health care provider. ?Document Revised: 04/06/2019 Document Reviewed: 04/06/2019 ?Elsevier Patient Education ? [place]. ? ?

## 2021-09-01 NOTE — Progress Notes (Signed)
? ?Electrophysiology Office Note ? ? ?Date:  09/01/2021  ? ?ID:  Melissa Clemente, DOB Sep 29, 1960, MRN [id number] ? ?PCP:  Madison Hickman, FNP  ?Cardiologist:  Aundra Dubin ?Primary Electrophysiologist:  Drago Hammonds Meredith Leeds, MD   ? ?Chief Complaint: atrial flutter ?  ?History of Present Illness: ?Christopher Romero is a 61 y.o. male who is being seen today for the evaluation of atrial flutter at the request of [place], Maricela Bo, FNP. Presenting today for electrophysiology evaluation. ? ?He has a history significant for hypertension, hyperlipidemia, obesity.  He was admitted to the hospital March 2023 with atrial flutter and rapid rates and new onset systolic heart failure.  Ejection fraction was found to be 20 to 25%.  Echo showed no apical thrombus.  He was started on amiodarone drip.  He had a left heart and right heart catheterization that showed elevated right and left heart filling pressures and nonobstructive coronary artery disease.  He converted to sinus rhythm on amiodarone. ? ?Today, he denies symptoms of palpitations, chest pain, shortness of breath, orthopnea, PND, lower extremity edema, claudication, dizziness, presyncope, syncope, bleeding, or neurologic sequela. The patient is tolerating medications without difficulties.  ? ? ?Past Medical History:  ?Diagnosis Date  ? Dyslipidemia   ? Hypertension   ? Visual impairment   ? L is severely impaired, R is about 20/30  ? ?Past Surgical History:  ?Procedure Laterality Date  ? ANEURYSM COILING  2008  ? visual impairment, sees little out of the left  ? KNEE ARTHROSCOPY    ? LEFT HEART CATH AND CORONARY ANGIOGRAPHY N/A 07/21/2021  ? Procedure: LEFT HEART CATH AND CORONARY ANGIOGRAPHY;  Surgeon: Larey Dresser, MD;  Location: No Name [facility];  Service: Cardiovascular;  Laterality: N/A;  ? RIGHT HEART CATH N/A 07/18/2021  ? Procedure: RIGHT HEART CATH;  Surgeon: Larey Dresser, MD;  Location: [place] [facility];  Service: Cardiovascular;  Laterality: N/A;  ? ? ? ?Current  Outpatient Medications  ?Medication Sig Dispense Refill  ? acetaminophen (TYLENOL) 500 MG tablet Take 1,000 mg by mouth every 6 (six) hours as needed for mild pain.    ? amiodarone (PACERONE) 200 MG tablet Take 200 mg by mouth daily.    ? apixaban (ELIQUIS) 5 MG TABS tablet Take 1 tablet (5 mg total) by mouth 2 (two) times daily. 60 tablet 3  ? carvedilol (COREG) 3.125 MG tablet Take 1 tablet (3.125 mg total) by mouth 2 (two) times daily. 60 tablet 3  ? dapagliflozin propanediol (FARXIGA) 10 MG TABS tablet Take 1 tablet (10 mg total) by mouth daily. 30 tablet 0  ? digoxin (LANOXIN) 0.125 MG tablet Take 1 tablet (0.125 mg total) by mouth daily. 30 tablet 0  ? furosemide (LASIX) 20 MG tablet Take 1 tablet (20 mg total) by mouth as needed for fluid or edema. 1 tablet daily as needed for weight gain of 3 lbs in 24 hour or 5 lbs in a week 30 tablet 3  ? rosuvastatin (CRESTOR) 20 MG tablet Take 1 tablet (20 mg total) by mouth daily. 30 tablet 0  ? sacubitril-valsartan (ENTRESTO) 97-103 MG Take 1 tablet by mouth 2 (two) times daily. 60 tablet 6  ? spironolactone (ALDACTONE) 25 MG tablet Take 1 tablet (25 mg total) by mouth daily. 30 tablet 0  ? ?No current facility-administered medications for this visit.  ? ? ?Allergies:   Patient has no known allergies.  ? ?Social History:  The patient  reports that he has quit smoking.  His smoking use included cigarettes. He has a 80.00 pack-year smoking history. He has never used smokeless tobacco. He reports current alcohol use. He reports that he does not use drugs.  ? ?Family History:  The patient's family history includes CAD in his sister and another family member; CAD (age of onset: 37) in his father; Cancer in his father; Dementia in his mother; Heart failure in an other family member; Hypertension in his brother and sister; Parkinsonism in his mother.  ? ? ?ROS:  Please see the history of present illness.   Otherwise, review of systems is positive for none.   All other systems  are reviewed and negative.  ? ? ?PHYSICAL EXAM: ?VS:  BP 118/80   Pulse 73   Ht 5\' 10"  (1.778 m)   Wt 272 lb (123.4 kg)   SpO2 94%   BMI 39.03 kg/m?  , BMI Body mass index is 39.03 kg/m?. ?GEN: Well nourished, well developed, in no acute distress  ?HEENT: normal  ?Neck: no JVD, carotid bruits, or masses ?Cardiac: RRR; no murmurs, rubs, or gallops,no edema  ?Respiratory:  clear to auscultation bilaterally, normal work of breathing ?GI: soft, nontender, nondistended, + BS ?MS: no deformity or atrophy  ?Skin: warm and dry ?Neuro:  Strength and sensation are intact ?Psych: euthymic mood, full affect ? ?EKG:  EKG is ordered today. ?Personal review of the ekg ordered shows sinus rhythm, rate 61 ? ?Recent Labs: ?07/17/2021: TSH 1.535 ?07/18/2021: Magnesium 2.2 ?07/22/2021: ALT 74 ?07/29/2021: Hemoglobin 18.1; Platelets 328 ?08/19/2021: BUN 13; Creatinine, Ser 1.03; Potassium 4.4; Sodium 137  ? ? ?Lipid Panel  ?   ?Component Value Date/Time  ? CHOL 152 07/17/2021 1815  ? TRIG 96 07/17/2021 1815  ? HDL 18 (L) 07/17/2021 1815  ? CHOLHDL 8.4 07/17/2021 1815  ? VLDL 19 07/17/2021 1815  ? [place] 115 (H) 07/17/2021 1815  ? ? ? ?Wt Readings from Last 3 Encounters:  ?09/01/21 272 lb (123.4 kg)  ?08/19/21 273 lb 12.8 oz (124.2 kg)  ?07/29/21 280 lb 9.6 oz (127.3 kg)  ?  ? ? ?Other studies Reviewed: ?Additional studies/ records that were reviewed today include: TTE 07/17/21  ?Review of the above records today demonstrates:  ? 1. No apical thrombus with Definity contrast. Left ventricular ejection  ?fraction, by estimation, is 20 to 25%. Left ventricular ejection fraction  ?by 2D MOD biplane is 24.2 %. The left ventricle has severely decreased  ?function. The left ventricle  ?demonstrates global hypokinesis. The left ventricular internal cavity size  ?was severely dilated. Left ventricular diastolic function could not be  ?evaluated.  ? 2. Right ventricular systolic function is moderately reduced. The right  ?ventricular size is mildly  enlarged. There is normal pulmonary artery  ?systolic pressure. The estimated right ventricular systolic pressure is  ?123XX123 mmHg.  ? 3. Left atrial size was mildly dilated.  ? 4. The mitral valve is grossly normal. Moderate to severe mitral valve  ?regurgitation.  ? 5. The aortic valve is tricuspid. Aortic valve regurgitation is not  ?visualized.  ? 6. The inferior vena cava is dilated in size with <50% respiratory  ?variability, suggesting right atrial pressure of 15 mmHg.  ? ?LHC 07/21/21 ?1. Mild nonobstructive CAD.  ?2. LVEDP 11 ? ?ASSESSMENT AND PLAN: ? ?1.  Chronic systolic heart failure due to nonischemic cardiomyopathy: Currently on optimal medical therapy with carvedilol 3.125 mg twice daily, Entresto 97/103 mg twice daily, Aldactone 25 mg daily, Farxiga 10 mg daily.  He  Timon Geissinger  need a repeat echo once she is remained in sinus rhythm for 3 months. ? ?2.  Typical atrial flutter: Currently on amiodarone 200 mg daily, Eliquis 5 mg twice daily.  CHA2DS2-VASc of 1.  He would benefit from ablation to get off of amiodarone.  Risk and benefits of been discussed which include bleeding, tamponade, heart block, stroke.  He understands these risks and is agreed to the procedure. ? ?3.  Moderate to severe mitral regurgitation: Likely functional.  Yuta Cipollone be reassessed at his next echo. ? ?Discussed with primary cardiology ? ?Current medicines are reviewed at length with the patient today.   ?The patient does not have concerns regarding his medicines.  The following changes were made today:  none ? ?Labs/ tests ordered today include:  ?Orders Placed This Encounter  ?Procedures  ? Basic metabolic panel  ? CBC  ? EKG 12-Lead  ? ? ? ?Disposition:   FU with Jalesia Loudenslager 1 months ? ?Signed, ?Kadeja Granada Meredith Leeds, MD  ?09/01/2021 2:50 PM    ? ?[facility] ?[address] ?[address] ?[place] [address] ?[phone] (office) ?[phone] (fax) ? ?

## 2021-09-02 LAB — BASIC METABOLIC PANEL
BUN/Creatinine Ratio: 17 (ref 10–24)
BUN: 15 mg/dL (ref 8–27)
CO2: 18 mmol/L — ABNORMAL LOW (ref 20–29)
Calcium: 10 mg/dL (ref 8.6–10.2)
Chloride: 103 mmol/L (ref 96–106)
Creatinine, Ser: 0.87 mg/dL (ref 0.76–1.27)
Glucose: 89 mg/dL (ref 70–99)
Potassium: 5 mmol/L (ref 3.5–5.2)
Sodium: 140 mmol/L (ref 134–144)
eGFR: 98 mL/min/{1.73_m2} (ref >59)

## 2021-09-02 LAB — SPECIMEN STATUS REPORT

## 2021-09-12 ENCOUNTER — Other Ambulatory Visit (HOSPITAL_COMMUNITY): Payer: Self-pay | Admitting: *Deleted

## 2021-09-12 MED ORDER — AMIODARONE HCL 200 MG PO TABS: 200.0000 mg | ORAL_TABLET | Freq: Every day | ORAL | 3 refills | Status: DC

## 2021-09-19 ENCOUNTER — Other Ambulatory Visit (HOSPITAL_COMMUNITY): Payer: Self-pay

## 2021-09-19 MED ORDER — DIGOXIN 125 MCG PO TABS: 0.1250 mg | ORAL_TABLET | Freq: Every day | ORAL | 0 refills | Status: DC

## 2021-09-19 MED ORDER — ROSUVASTATIN CALCIUM 20 MG PO TABS
20.0000 mg | ORAL_TABLET | Freq: Every day | ORAL | 0 refills | Status: DC
Start: 2021-09-19 — End: 2021-10-23

## 2021-09-29 ENCOUNTER — Other Ambulatory Visit (HOSPITAL_COMMUNITY): Payer: Self-pay

## 2021-09-29 MED ORDER — SPIRONOLACTONE 25 MG PO TABS
25.0000 mg | ORAL_TABLET | Freq: Every day | ORAL | 3 refills | Status: DC
Start: 2021-09-29 — End: 2021-11-21

## 2021-10-03 ENCOUNTER — Other Ambulatory Visit (HOSPITAL_COMMUNITY): Payer: Self-pay

## 2021-10-03 MED ORDER — DAPAGLIFLOZIN PROPANEDIOL 10 MG PO TABS: 10.0000 mg | ORAL_TABLET | Freq: Every day | ORAL | 11 refills | Status: DC

## 2021-10-21 ENCOUNTER — Encounter (HOSPITAL_COMMUNITY): Payer: Self-pay | Admitting: Cardiology

## 2021-10-21 ENCOUNTER — Ambulatory Visit (HOSPITAL_BASED_OUTPATIENT_CLINIC_OR_DEPARTMENT_OTHER)
Admission: RE | Admit: 2021-10-21 | Discharge: 2021-10-21 | Disposition: A | Discharge status: Home / Self Care | Payer: Managed Care, Other (non HMO) | Source: Ambulatory Visit | Attending: Cardiology | Admitting: Cardiology

## 2021-10-21 ENCOUNTER — Ambulatory Visit (HOSPITAL_COMMUNITY)
Admission: RE | Admit: 2021-10-21 | Discharge: 2021-10-21 | Disposition: A | Discharge status: Home / Self Care | Payer: Managed Care, Other (non HMO) | Source: Ambulatory Visit | Attending: Cardiology | Admitting: Cardiology

## 2021-10-21 VITALS — BP 130/70 | HR 73 | Wt 275.8 lb

## 2021-10-21 DIAGNOSIS — I5021 Acute systolic (congestive) heart failure: Secondary | ICD-10-CM | POA: Diagnosis not present

## 2021-10-21 DIAGNOSIS — E785 Hyperlipidemia, unspecified: Secondary | ICD-10-CM | POA: Insufficient documentation

## 2021-10-21 DIAGNOSIS — E669 Obesity, unspecified: Secondary | ICD-10-CM | POA: Diagnosis not present

## 2021-10-21 DIAGNOSIS — I5022 Chronic systolic (congestive) heart failure: Secondary | ICD-10-CM

## 2021-10-21 DIAGNOSIS — I110 Hypertensive heart disease with heart failure: Secondary | ICD-10-CM | POA: Diagnosis not present

## 2021-10-21 DIAGNOSIS — I4892 Unspecified atrial flutter: Secondary | ICD-10-CM | POA: Diagnosis not present

## 2021-10-21 DIAGNOSIS — Z6839 Body mass index (BMI) 39.0-39.9, adult: Secondary | ICD-10-CM | POA: Diagnosis not present

## 2021-10-21 DIAGNOSIS — R0683 Snoring: Secondary | ICD-10-CM | POA: Diagnosis not present

## 2021-10-21 DIAGNOSIS — I340 Nonrheumatic mitral (valve) insufficiency: Secondary | ICD-10-CM | POA: Diagnosis not present

## 2021-10-21 LAB — COMPREHENSIVE METABOLIC PANEL
ALT: 23 U/L (ref 0–44)
AST: 21 U/L (ref 15–41)
Albumin: 4 g/dL (ref 3.5–5.0)
Alkaline Phosphatase: 40 U/L (ref 38–126)
Anion gap: 10 (ref 5–15)
BUN: 20 mg/dL (ref 8–23)
CO2: 22 mmol/L (ref 22–32)
Calcium: 9.5 mg/dL (ref 8.9–10.3)
Chloride: 102 mmol/L (ref 98–111)
Creatinine, Ser: 0.91 mg/dL (ref 0.61–1.24)
GFR, Estimated: >60 mL/min (ref >60)
Glucose, Bld: 89 mg/dL (ref 70–99)
Potassium: 4.6 mmol/L (ref 3.5–5.1)
Sodium: 134 mmol/L — ABNORMAL LOW (ref 135–145)
Total Bilirubin: 0.6 mg/dL (ref 0.3–1.2)
Total Protein: 6.4 g/dL — ABNORMAL LOW (ref 6.5–8.1)

## 2021-10-21 LAB — ECHOCARDIOGRAM COMPLETE
AR max vel: 2.67 cm2
AV Peak grad: 10.4 mmHg
Ao pk vel: 1.61 m/s
Area-P 1/2: 5.23 cm2
Calc EF: 43.2 %
P 1/2 time: 612 msec
S' Lateral: 3.8 cm
Single Plane A2C EF: 47.8 %
Single Plane A4C EF: 43.1 %

## 2021-10-21 LAB — CBC
HCT: 46.3 % (ref 39.0–52.0)
Hemoglobin: 16.1 g/dL (ref 13.0–17.0)
MCH: 30.8 pg (ref 26.0–34.0)
MCHC: 34.8 g/dL (ref 30.0–36.0)
MCV: 88.5 fL (ref 80.0–100.0)
Platelets: 291 10*3/uL (ref 150–400)
RBC: 5.23 MIL/uL (ref 4.22–5.81)
RDW: 15.6 % — ABNORMAL HIGH (ref 11.5–15.5)
WBC: 11.7 10*3/uL — ABNORMAL HIGH (ref 4.0–10.5)
nRBC: 0 % (ref 0.0–0.2)

## 2021-10-21 LAB — TSH: TSH: 1.541 u[IU]/mL (ref 0.350–4.500)

## 2021-10-21 MED ORDER — CARVEDILOL 6.25 MG PO TABS: 6.2500 mg | ORAL_TABLET | Freq: Two times a day (BID) | ORAL | 4 refills | Status: DC

## 2021-10-21 NOTE — Patient Instructions (Signed)
Stop Digoxin  Increase Carvedilol to 6.25mg  Twice daily  Labs done today, your results will be available in MyChart, we will contact you for abnormal readings.  Your physician recommends that you schedule a follow-up appointment in: 3-4 months  If you have any questions or concerns before your next appointment please send [place] a message through [place] or call our office at [phone].    TO LEAVE A MESSAGE FOR THE NURSE SELECT OPTION 2, PLEASE LEAVE A MESSAGE INCLUDING: YOUR NAME DATE OF BIRTH CALL BACK NUMBER REASON FOR CALL**this is important as we prioritize the call backs  YOU WILL RECEIVE A CALL BACK THE SAME DAY AS LONG AS YOU CALL BEFORE 4:00 PM  At the [facility], you and your health needs are our priority. As part of our continuing mission to provide you with exceptional heart care, we have created designated Provider Care Teams. These Care Teams include your primary Cardiologist (physician) and Advanced Practice Providers (APPs- Physician Assistants and Nurse Practitioners) who all work together to provide you with the care you need, when you need it.   You may see any of the following providers on your designated Care Team at your next follow up: Dr Glori Bickers Dr Haynes Kerns, NP Lyda Jester, [place] [facility] [place], [place] Audry Riles, PharmD   Please be sure to bring in all your medications bottles to every appointment.

## 2021-10-22 NOTE — Progress Notes (Signed)
ADVANCED HF CLINIC NOTE   Primary Care: Madison Hickman, FNP HF Cardiologist: Dr. Aundra Dubin  HPI: Christopher Romero is a 61 y.o. male w/ PMH of HTN, HLD and obesity who was diagnosed with AFL and systolic heart failure in 3/23.  Admitted 3/23 with AFL with RVR and new systolic heart failure. Echo showed severe biventricular dysfunction, LVEF 20-25% w/ global HK, RV moderately reduced, mod-severe MR. No apical thrombus w/ definity contrast. Started on amiodarone gtt. Concern for low output, started on milrinone and continued IV diuresis. R/LHC showed mild nonobstructive CAD, elevated right and left heart filling pressures, mild pulmonary venous hypertension and low cardiac output. He was placed on digoxin and GDMT titrated. 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 Converted to NSR spontaneously, amiodarone changed to po and milrinone weaned off. Advised EP follow up to discuss ablation. Discharged home, weight 277 lbs.  He has seen EP, has plan for AFL ablation.  Echo done today showed EF 45-50%, normal RV.   Today he returns for HF followup.  Generally doing well.  Still "gives out" more quickly than in the past, but no dyspnea walking on flat ground.  He walks up to 8 miles/day at work in a [employer].  He is back at work full time.  No palpitations, he is in NSR today.  No BRPBR/melena.  No lightheadedness.  No chest pain.    Labs (3/23): K 4.8, creatinine 1.05 Labs (4/23): K 5, creatinine 0.87  PMH: 1. HTN 2. Hyperlipidemia 3. Atrial flutter 4. Chronic systolic CHF: Nonischemic cardiomyopathy, ?primarily tachycardia-mediated CMP.  - R/LHC (3/23): mild nonobstructive CAD; RA mean 2, PA 47/39, PCWP mean 30, CI 1.68 with PVR 2.4 WU.  - Echo (3/23): EF 20-25%, global hypokinesis, severe LV dilation, mildly dilated and moderately dysfunctional RV, moderate-severe functional MR, dilated IVC - Echo (6/23): EF 45-50%, normal RV   Current Outpatient Medications   Medication Sig Dispense Refill   acetaminophen (TYLENOL) 500 MG tablet Take 1,000 mg by mouth every 6 (six) hours as needed for mild pain.     amiodarone (PACERONE) 200 MG tablet Take 1 tablet (200 mg total) by mouth daily. 30 tablet 3   apixaban (ELIQUIS) 5 MG TABS tablet Take 1 tablet (5 mg total) by mouth 2 (two) times daily. 60 tablet 3   dapagliflozin propanediol (FARXIGA) 10 MG TABS tablet Take 1 tablet (10 mg total) by mouth daily. 30 tablet 11   furosemide (LASIX) 20 MG tablet Take 1 tablet (20 mg total) by mouth as needed for fluid or edema. 1 tablet daily as needed for weight gain of 3 lbs in 24 hour or 5 lbs in a week 30 tablet 3   rosuvastatin (CRESTOR) 20 MG tablet Take 1 tablet (20 mg total) by mouth daily. 30 tablet 0   sacubitril-valsartan (ENTRESTO) 97-103 MG Take 1 tablet by mouth 2 (two) times daily. 60 tablet 6   spironolactone (ALDACTONE) 25 MG tablet Take 1 tablet (25 mg total) by mouth daily. 30 tablet 3   carvedilol (COREG) 6.25 MG tablet Take 1 tablet (6.25 mg total) by mouth 2 (two) times daily. 60 tablet 4   No current facility-administered medications for this encounter.   No Known Allergies  Social History   Socioeconomic History   Marital status: Single    Spouse name: Not on file   Number of children: Not on file   Years of education: Not on file   Highest education level: Not  on file  Occupational History   Occupation: foam Patent examiner  Tobacco Use   Smoking status: Former    Packs/day: 2.00    Years: 40.00    Pack years: 80.00    Types: Cigarettes   Smokeless tobacco: Never  Substance and Sexual Activity   Alcohol use: Yes    Comment: 2-12 daily, denies having a problem with ETOH   Drug use: Never   Sexual activity: Not on file  Other Topics Concern   Not on file  Social History Narrative   Not on file   Social Determinants of Health   Financial Resource Strain: Not on file  Food Insecurity: Not on file  Transportation Needs:  Not on file  Physical Activity: Not on file  Stress: Not on file  Social Connections: Not on file  Intimate Partner Violence: Not on file   Family History  Problem Relation Age of Onset   Dementia Mother    Parkinsonism Mother    Cancer Father    CAD Father 61   Hypertension Sister    CAD Sister    Hypertension Brother    CAD Other    Heart failure Other    BP 130/70   Pulse 73   Wt 125.1 kg (275 lb 12.8 oz)   SpO2 95%   BMI 39.57 kg/m   Wt Readings from Last 3 Encounters:  10/21/21 125.1 kg (275 lb 12.8 oz)  09/01/21 123.4 kg (272 lb)  08/19/21 124.2 kg (273 lb 12.8 oz)   PHYSICAL EXAM: General: NAD Neck: No JVD, no thyromegaly or thyroid nodule.  Lungs: Clear to auscultation bilaterally with normal respiratory effort. CV: Nondisplaced PMI.  Heart regular S1/S2, no S3/S4, no murmur.  No peripheral edema.  No carotid bruit.  Normal pedal pulses.  Abdomen: Soft, nontender, no hepatosplenomegaly, no distention.  Skin: Intact without lesions or rashes.  Neurologic: Alert and oriented x 3.  Psych: Normal affect. Extremities: No clubbing or cyanosis.  HEENT: Normal.   ASSESSMENT & PLAN: 1. Chronic systolic CHF:  Patient admitted in 3/23 with CHF, noted to be in atrial flutter with RVR.  Echo in 3/23 with EF 20-25%, global hypokinesis, severe LV dilation, mildly dilated and moderately dysfunctional RV, moderate-severe functional MR, dilated IVC.  He developed AKI with creatinine rising 0.98 => 2 without aggressive diuresis. [place] 3/23 showed severely elevated right and left heart filling pressures with low cardiac output.  Coronary angiography with mild nonobstructive CAD.  Cause of cardiomyopathy uncertain.  Suspect that this was a tachycardia-mediated CMP.  Viral myocarditis would also be a consideration.  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  Repeat echo done today showed EF up to 45-50%.  Stable NYHA I-II. He is not volume overloaded on exam.  - Increase Coreg  to 6.25 mg bid. - Stop digoxin with EF improved to 45-50%.  - Continue Entresto 97/103 mg bid. BMET today. - Continue Lasix 20 mg PRN weight gain/edema. - Continue spironolactone 25 mg daily.  - Continue Farxiga 10 mg daily.  - EF is out of ICD range.  2. Atrial flutter: First noted in 3/23.  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 NSR today.  - Continue amiodarone 200 mg daily for now, hopefully can stop after AFL ablation.  Check LFTs and TSH, needs regular eye exam.  - Continue apixaban.  - Atrial flutter ablation scheduled for July.  3. Mitral regurgitation: Moderate-severe on 3/23 echo, suspect functional. Trivial MR on 6/23 echo.  4. Snoring: We discussed  sleep study, he does not want to wear CPAP or other device if he were to have OSA.  Followup in 3 months with APP.   Loralie Champagne 10/22/2021

## 2021-10-23 ENCOUNTER — Other Ambulatory Visit (HOSPITAL_COMMUNITY): Payer: Self-pay

## 2021-10-23 MED ORDER — ROSUVASTATIN CALCIUM 20 MG PO TABS
20.0000 mg | ORAL_TABLET | Freq: Every day | ORAL | 0 refills | Status: DC
Start: 2021-10-23 — End: 2021-12-29

## 2021-11-10 ENCOUNTER — Other Ambulatory Visit (HOSPITAL_COMMUNITY): Payer: Managed Care, Other (non HMO)

## 2021-11-10 ENCOUNTER — Encounter (HOSPITAL_COMMUNITY): Payer: Managed Care, Other (non HMO) | Admitting: Internal Medicine

## 2021-11-20 ENCOUNTER — Other Ambulatory Visit (HOSPITAL_COMMUNITY): Payer: Self-pay

## 2021-11-21 ENCOUNTER — Other Ambulatory Visit (HOSPITAL_COMMUNITY): Payer: Self-pay

## 2021-11-21 MED ORDER — AMIODARONE HCL 200 MG PO TABS
200.0000 mg | ORAL_TABLET | Freq: Every day | ORAL | 3 refills | Status: DC
Start: 2021-11-21 — End: 2022-01-26

## 2021-11-21 MED ORDER — SPIRONOLACTONE 25 MG PO TABS: 25.0000 mg | ORAL_TABLET | Freq: Every day | ORAL | 3 refills | Status: DC

## 2021-11-21 MED ORDER — APIXABAN 5 MG PO TABS: 5.0000 mg | ORAL_TABLET | Freq: Two times a day (BID) | ORAL | 3 refills | Status: DC

## 2021-11-24 ENCOUNTER — Telehealth: Payer: Self-pay | Admitting: Cardiology

## 2021-11-24 NOTE — Telephone Encounter (Signed)
Called transferred to nurse

## 2021-11-24 NOTE — Telephone Encounter (Signed)
Pt informed that insurance is denying ablation scheduled for tomorrow. Informed that On Friday, the insurance company sent a letter that billing, pre cert, received this morning that it intends to deny because they do not believe the procedure meets medical necessity. Explained that billing submitted the reconsideration this morning with all of the supporting clinical information.  The reconsideration could take anywhere from 72-96 hrs.  Pt would like me to call him later today to discuss a rescheduling date. Aware I will call back today/this week after 4:30 pm

## 2021-11-25 ENCOUNTER — Encounter (HOSPITAL_COMMUNITY): Admission: RE | Payer: Managed Care, Other (non HMO) | Source: Home / Self Care

## 2021-11-25 ENCOUNTER — Ambulatory Visit (HOSPITAL_COMMUNITY)
Admission: RE | Admit: 2021-11-25 | Payer: Managed Care, Other (non HMO) | Source: Home / Self Care | Admitting: Cardiology

## 2021-11-25 SURGERY — A-FLUTTER ABLATION
Anesthesia: General

## 2021-11-27 ENCOUNTER — Encounter (HOSPITAL_COMMUNITY): Payer: Self-pay | Admitting: *Deleted

## 2021-12-04 ENCOUNTER — Other Ambulatory Visit (HOSPITAL_COMMUNITY): Payer: Self-pay

## 2021-12-04 MED ORDER — DAPAGLIFLOZIN PROPANEDIOL 10 MG PO TABS
10.0000 mg | ORAL_TABLET | Freq: Every day | ORAL | 3 refills | Status: AC
Start: 2021-12-04 — End: ?

## 2021-12-04 NOTE — Telephone Encounter (Signed)
Meds ordered this encounter  Medications   dapagliflozin propanediol (FARXIGA) 10 MG TABS tablet    Sig: Take 1 tablet (10 mg total) by mouth daily.    Dispense:  90 tablet    Refill:  3

## 2021-12-10 ENCOUNTER — Telehealth: Payer: Self-pay | Admitting: Cardiology

## 2021-12-10 NOTE — Telephone Encounter (Signed)
R/s ablation to 8/18. Sister agreeable to that day. Aware I will call her back tomorrow/next week to review instructions. She is agreeable to plan.

## 2021-12-10 NOTE — Telephone Encounter (Signed)
Patient's sister called to talk with Dr. Daiva Huge or nurse in regards to rescheduling his ablation. Please call back

## 2021-12-12 ENCOUNTER — Other Ambulatory Visit (HOSPITAL_COMMUNITY): Payer: Self-pay

## 2021-12-12 MED ORDER — ENTRESTO 97-103 MG PO TABS
1.0000 | ORAL_TABLET | Freq: Two times a day (BID) | ORAL | 3 refills | Status: AC
Start: 2021-12-12 — End: ?

## 2021-12-18 ENCOUNTER — Encounter: Payer: Self-pay | Admitting: *Deleted

## 2021-12-18 NOTE — Telephone Encounter (Signed)
Spoke to sister, ablation instructions reviewed. Aware will send via mychart. Advised to keep the [place] office appt scheduled for 8/14 and will do pre procedure blood work that day. Sister verbalized understanding and agreeable to plan.

## 2021-12-29 ENCOUNTER — Encounter: Payer: Self-pay | Admitting: Cardiology

## 2021-12-29 ENCOUNTER — Ambulatory Visit (INDEPENDENT_AMBULATORY_CARE_PROVIDER_SITE_OTHER): Payer: Managed Care, Other (non HMO) | Admitting: Cardiology

## 2021-12-29 ENCOUNTER — Other Ambulatory Visit: Payer: Managed Care, Other (non HMO)

## 2021-12-29 VITALS — BP 138/76 | HR 74 | Ht 70.0 in | Wt 279.0 lb

## 2021-12-29 DIAGNOSIS — Z01812 Encounter for preprocedural laboratory examination: Secondary | ICD-10-CM

## 2021-12-29 DIAGNOSIS — I483 Typical atrial flutter: Secondary | ICD-10-CM

## 2021-12-29 NOTE — Progress Notes (Signed)
Electrophysiology Office Note   Date:  12/29/2021   ID:  Christopher Romero, DOB [birth year], MRN [id number]  PCP:  Jerrye Bushy, FNP  Cardiologist:  Shirlee Latch Primary Electrophysiologist:  Hatice Bubel Jorja Loa, MD    Chief Complaint: atrial flutter   History of Present Illness: Christopher Romero is a 61 y.o. male who is being seen today for the evaluation of atrial flutter at the request of Jerrye Bushy, FNP. Presenting today for electrophysiology evaluation.  He has a history seen for hypertension, hyperlipidemia, obesity.  He was admitted to the hospital March 2023 with atrial flutter and rapid rates as well as new onset heart failure.  Ejection fraction is 20 to 25%.  He was started on IV amiodarone.  Left heart catheterization showed elevated right and left heart filling pressures and nonobstructive coronary disease.  He converted to sinus rhythm with amiodarone.  Today, denies symptoms of palpitations, chest pain, shortness of breath, orthopnea, PND, lower extremity edema, claudication, dizziness, presyncope, syncope, bleeding, or neurologic sequela. The patient is tolerating medications without difficulties.  Since being seen he has done well.  He has had no chest pain or shortness of breath.  He is remained in sinus rhythm without issue.  He is overall quite happy with his control.  He is ready for ablation later this week.  Past Medical History:  Diagnosis Date   Dyslipidemia    Hypertension    Visual impairment    L is severely impaired, R is about 20/30   Past Surgical History:  Procedure Laterality Date   ANEURYSM COILING  2008   visual impairment, sees little out of the left   KNEE ARTHROSCOPY     LEFT HEART CATH AND CORONARY ANGIOGRAPHY N/A 07/21/2021   Procedure: LEFT HEART CATH AND CORONARY ANGIOGRAPHY;  Surgeon: Laurey Morale, MD;  Location: [facility] INVASIVE CV LAB;  Service: Cardiovascular;  Laterality: N/A;   RIGHT HEART CATH N/A 07/18/2021   Procedure: RIGHT HEART CATH;   Surgeon: Laurey Morale, MD;  Location: [facility] INVASIVE CV LAB;  Service: Cardiovascular;  Laterality: N/A;     Current Outpatient Medications  Medication Sig Dispense Refill   acetaminophen (TYLENOL) 500 MG tablet Take 1,000 mg by mouth every 6 (six) hours as needed for mild pain.     amiodarone (PACERONE) 200 MG tablet Take 1 tablet (200 mg total) by mouth daily. 30 tablet 3   apixaban (ELIQUIS) 5 MG TABS tablet Take 1 tablet (5 mg total) by mouth 2 (two) times daily. 60 tablet 3   dapagliflozin propanediol (FARXIGA) 10 MG TABS tablet Take 1 tablet (10 mg total) by mouth daily. 90 tablet 3   furosemide (LASIX) 20 MG tablet Take 1 tablet (20 mg total) by mouth as needed for fluid or edema. 1 tablet daily as needed for weight gain of 3 lbs in 24 hour or 5 lbs in a week 30 tablet 3   sacubitril-valsartan (ENTRESTO) 97-103 MG Take 1 tablet by mouth 2 (two) times daily. 180 tablet 3   spironolactone (ALDACTONE) 25 MG tablet Take 1 tablet (25 mg total) by mouth daily. 30 tablet 3   No current facility-administered medications for this visit.    Allergies:   Patient has no known allergies.   Social History:  The patient  reports that he has quit smoking. His smoking use included cigarettes. He has a 80.00 pack-year smoking history. He has never used smokeless tobacco. He reports current alcohol use. He reports that he does not  use drugs.   Family History:  The patient's family history includes CAD in his sister and another family member; CAD (age of onset: 55) in his father; Cancer in his father; Dementia in his mother; Heart failure in an other family member; Hypertension in his brother and sister; Parkinsonism in his mother.   ROS:  Please see the history of present illness.   Otherwise, review of systems is positive for none.   All other systems are reviewed and negative.   PHYSICAL EXAM: VS:  BP 138/76   Pulse 74   Ht 5\' 10"  (1.778 m)   Wt 279 lb (126.6 kg)   BMI 40.03 kg/m  , BMI Body  mass index is 40.03 kg/m. GEN: Well nourished, well developed, in no acute distress  HEENT: normal  Neck: no JVD, carotid bruits, or masses Cardiac: RRR; no murmurs, rubs, or gallops,no edema  Respiratory:  clear to auscultation bilaterally, normal work of breathing GI: soft, nontender, nondistended, + BS MS: no deformity or atrophy  Skin: warm and dry Neuro:  Strength and sensation are intact Psych: euthymic mood, full affect  EKG:  EKG is ordered today. Personal review of the ekg ordered shows sinus rhythm   Recent Labs: 07/18/2021: Magnesium 2.2 10/21/2021: ALT 23; BUN 20; Creatinine, Ser 0.91; Hemoglobin 16.1; Platelets 291; Potassium 4.6; Sodium 134; TSH 1.541    Lipid Panel     Component Value Date/Time   CHOL 152 07/17/2021 1815   TRIG 96 07/17/2021 1815   HDL 18 (L) 07/17/2021 1815   CHOLHDL 8.4 07/17/2021 1815   VLDL 19 07/17/2021 1815   LDLCALC 115 (H) 07/17/2021 1815     Wt Readings from Last 3 Encounters:  12/29/21 279 lb (126.6 kg)  10/21/21 275 lb 12.8 oz (125.1 kg)  09/01/21 272 lb (123.4 kg)      Other studies Reviewed: Additional studies/ records that were reviewed today include: TTE 10/21/21  Review of the above records today demonstrates:   1. Left ventricular ejection fraction, by estimation, is 45 to 50%. The  left ventricle has mildly decreased function. The left ventricle  demonstrates global hypokinesis. There is mild concentric left ventricular  hypertrophy. Left ventricular diastolic  parameters are consistent with Grade I diastolic dysfunction (impaired  relaxation).   2. Right ventricular systolic function is normal. The right ventricular  size is normal. Tricuspid regurgitation signal is inadequate for assessing  PA pressure.   3. The mitral valve is normal in structure. Trivial mitral valve  regurgitation. No evidence of mitral stenosis.   4. The aortic valve is tricuspid. Aortic valve regurgitation is trivial.  No aortic stenosis is  present.   5. Aortic dilatation noted. There is mild dilatation of the aortic root,  measuring 38 mm.   6. The inferior vena cava is normal in size with greater than 50%  respiratory variability, suggesting right atrial pressure of 3 mmHg.   LHC 07/21/21 1. Mild nonobstructive CAD.  2. LVEDP 11  ASSESSMENT AND PLAN:  1.  Chronic systolic heart failure due to nonischemic cardiomyopathy: Currently on optimal medical therapy with carvedilol, Entresto, Aldactone, Farxiga.  Repeat echo shows improvement in ejection fraction.  We Christopher Romero continue with management per primary cardiology.  2.  Typical atrial flutter: Currently on Eliquis 5 mg twice daily.  CHA2DS2-VASc of 1.  Plan for flutter ablation 01/02/2022.  She understands the risks of procedure as were discussed previously.    3.  Moderate to severe mitral regurgitation: Likely functional.  Has  improved with improvement of his ejection fraction.  Plan per primary cardiology.   Current medicines are reviewed at length with the patient today.   The patient does not have concerns regarding his medicines.  The following changes were made today:  none  Labs/ tests ordered today include:  Orders Placed This Encounter  Procedures   EKG 12-Lead     Disposition:   FU with Angeles Zehner 1 months  Signed, Abrey Bradway Martin Shemar Plemmons, MD  12/29/2021 3:34 PM     [facility] [address] [place] [address] [phone] (office) [phone] (fax) 

## 2021-12-29 NOTE — Addendum Note (Signed)
Addended by: Baird Lyons on: 12/29/2021 03:43 PM   Modules accepted: Orders

## 2021-12-29 NOTE — H&P (View-Only) (Signed)
Electrophysiology Office Note   Date:  12/29/2021   ID:  Christopher Romero, DOB [birth year], MRN [id number]  PCP:  Jerrye Bushy, FNP  Cardiologist:  Shirlee Latch Primary Electrophysiologist:  Heavan Francom Jorja Loa, MD    Chief Complaint: atrial flutter   History of Present Illness: Christopher Romero is a 61 y.o. male who is being seen today for the evaluation of atrial flutter at the request of Jerrye Bushy, FNP. Presenting today for electrophysiology evaluation.  He has a history seen for hypertension, hyperlipidemia, obesity.  He was admitted to the hospital March 2023 with atrial flutter and rapid rates as well as new onset heart failure.  Ejection fraction is 20 to 25%.  He was started on IV amiodarone.  Left heart catheterization showed elevated right and left heart filling pressures and nonobstructive coronary disease.  He converted to sinus rhythm with amiodarone.  Today, denies symptoms of palpitations, chest pain, shortness of breath, orthopnea, PND, lower extremity edema, claudication, dizziness, presyncope, syncope, bleeding, or neurologic sequela. The patient is tolerating medications without difficulties.  Since being seen he has done well.  He has had no chest pain or shortness of breath.  He is remained in sinus rhythm without issue.  He is overall quite happy with his control.  He is ready for ablation later this week.  Past Medical History:  Diagnosis Date   Dyslipidemia    Hypertension    Visual impairment    L is severely impaired, R is about 20/30   Past Surgical History:  Procedure Laterality Date   ANEURYSM COILING  2008   visual impairment, sees little out of the left   KNEE ARTHROSCOPY     LEFT HEART CATH AND CORONARY ANGIOGRAPHY N/A 07/21/2021   Procedure: LEFT HEART CATH AND CORONARY ANGIOGRAPHY;  Surgeon: Laurey Morale, MD;  Location: [facility] INVASIVE CV LAB;  Service: Cardiovascular;  Laterality: N/A;   RIGHT HEART CATH N/A 07/18/2021   Procedure: RIGHT HEART CATH;   Surgeon: Laurey Morale, MD;  Location: [facility] INVASIVE CV LAB;  Service: Cardiovascular;  Laterality: N/A;     Current Outpatient Medications  Medication Sig Dispense Refill   acetaminophen (TYLENOL) 500 MG tablet Take 1,000 mg by mouth every 6 (six) hours as needed for mild pain.     amiodarone (PACERONE) 200 MG tablet Take 1 tablet (200 mg total) by mouth daily. 30 tablet 3   apixaban (ELIQUIS) 5 MG TABS tablet Take 1 tablet (5 mg total) by mouth 2 (two) times daily. 60 tablet 3   dapagliflozin propanediol (FARXIGA) 10 MG TABS tablet Take 1 tablet (10 mg total) by mouth daily. 90 tablet 3   furosemide (LASIX) 20 MG tablet Take 1 tablet (20 mg total) by mouth as needed for fluid or edema. 1 tablet daily as needed for weight gain of 3 lbs in 24 hour or 5 lbs in a week 30 tablet 3   sacubitril-valsartan (ENTRESTO) 97-103 MG Take 1 tablet by mouth 2 (two) times daily. 180 tablet 3   spironolactone (ALDACTONE) 25 MG tablet Take 1 tablet (25 mg total) by mouth daily. 30 tablet 3   No current facility-administered medications for this visit.    Allergies:   Patient has no known allergies.   Social History:  The patient  reports that he has quit smoking. His smoking use included cigarettes. He has a 80.00 pack-year smoking history. He has never used smokeless tobacco. He reports current alcohol use. He reports that he does not  use drugs.   Family History:  The patient's family history includes CAD in his sister and another family member; CAD (age of onset: 55) in his father; Cancer in his father; Dementia in his mother; Heart failure in an other family member; Hypertension in his brother and sister; Parkinsonism in his mother.   ROS:  Please see the history of present illness.   Otherwise, review of systems is positive for none.   All other systems are reviewed and negative.   PHYSICAL EXAM: VS:  BP 138/76   Pulse 74   Ht 5\' 10"  (1.778 m)   Wt 279 lb (126.6 kg)   BMI 40.03 kg/m  , BMI Body  mass index is 40.03 kg/m. GEN: Well nourished, well developed, in no acute distress  HEENT: normal  Neck: no JVD, carotid bruits, or masses Cardiac: RRR; no murmurs, rubs, or gallops,no edema  Respiratory:  clear to auscultation bilaterally, normal work of breathing GI: soft, nontender, nondistended, + BS MS: no deformity or atrophy  Skin: warm and dry Neuro:  Strength and sensation are intact Psych: euthymic mood, full affect  EKG:  EKG is ordered today. Personal review of the ekg ordered shows sinus rhythm   Recent Labs: 07/18/2021: Magnesium 2.2 10/21/2021: ALT 23; BUN 20; Creatinine, Ser 0.91; Hemoglobin 16.1; Platelets 291; Potassium 4.6; Sodium 134; TSH 1.541    Lipid Panel     Component Value Date/Time   CHOL 152 07/17/2021 1815   TRIG 96 07/17/2021 1815   HDL 18 (L) 07/17/2021 1815   CHOLHDL 8.4 07/17/2021 1815   VLDL 19 07/17/2021 1815   LDLCALC 115 (H) 07/17/2021 1815     Wt Readings from Last 3 Encounters:  12/29/21 279 lb (126.6 kg)  10/21/21 275 lb 12.8 oz (125.1 kg)  09/01/21 272 lb (123.4 kg)      Other studies Reviewed: Additional studies/ records that were reviewed today include: TTE 10/21/21  Review of the above records today demonstrates:   1. Left ventricular ejection fraction, by estimation, is 45 to 50%. The  left ventricle has mildly decreased function. The left ventricle  demonstrates global hypokinesis. There is mild concentric left ventricular  hypertrophy. Left ventricular diastolic  parameters are consistent with Grade I diastolic dysfunction (impaired  relaxation).   2. Right ventricular systolic function is normal. The right ventricular  size is normal. Tricuspid regurgitation signal is inadequate for assessing  PA pressure.   3. The mitral valve is normal in structure. Trivial mitral valve  regurgitation. No evidence of mitral stenosis.   4. The aortic valve is tricuspid. Aortic valve regurgitation is trivial.  No aortic stenosis is  present.   5. Aortic dilatation noted. There is mild dilatation of the aortic root,  measuring 38 mm.   6. The inferior vena cava is normal in size with greater than 50%  respiratory variability, suggesting right atrial pressure of 3 mmHg.   LHC 07/21/21 1. Mild nonobstructive CAD.  2. LVEDP 11  ASSESSMENT AND PLAN:  1.  Chronic systolic heart failure due to nonischemic cardiomyopathy: Currently on optimal medical therapy with carvedilol, Entresto, Aldactone, Farxiga.  Repeat echo shows improvement in ejection fraction.  We Alexcis Bicking continue with management per primary cardiology.  2.  Typical atrial flutter: Currently on Eliquis 5 mg twice daily.  CHA2DS2-VASc of 1.  Plan for flutter ablation 01/02/2022.  She understands the risks of procedure as were discussed previously.    3.  Moderate to severe mitral regurgitation: Likely functional.  Has  improved with improvement of his ejection fraction.  Plan per primary cardiology.   Current medicines are reviewed at length with the patient today.   The patient does not have concerns regarding his medicines.  The following changes were made today:  none  Labs/ tests ordered today include:  Orders Placed This Encounter  Procedures   EKG 12-Lead     Disposition:   FU with Sherril Shipman 1 months  Signed, Esme Freund Meredith Leeds, MD  12/29/2021 3:34 PM     [place] [address] [phone] (office) [phone] (fax)

## 2021-12-29 NOTE — Patient Instructions (Signed)
Medication Instructions:  Your physician recommends that you continue on your current medications as directed. Please refer to the Current Medication list given to you today.  *If you need a refill on your cardiac medications before your next appointment, please call your pharmacy*  Lab Work: Pre procedure labs today:  BMP & CBC  If you have labs (blood work) drawn today and your tests are completely normal, you will receive your results only by: MyChart Message (if you have MyChart) OR A paper copy in the mail If you have any lab test that is abnormal or we need to change your treatment, we will call you to review the results.   Testing/Procedures: Your physician has requested that you have cardiac CT within 7 days PRIOR to your ablation. Cardiac computed tomography (CT) is a painless test that uses an x-ray machine to take clear, detailed pictures of your heart.  Please follow instruction below located under "other instructions". You will get a call from our office to schedule the date for this test.  Your physician has recommended that you have an ablation. Catheter ablation is a medical procedure used to treat some cardiac arrhythmias (irregular heartbeats). During catheter ablation, a long, thin, flexible tube is put into a blood vessel in your groin (upper thigh), or neck. This tube is called an ablation catheter. It is then guided to your heart through the blood vessel. Radio frequency waves destroy small areas of heart tissue where abnormal heartbeats may cause an arrhythmia to start. Please follow instruction letter given to you today.   Follow-Up: At [facility], you and your health needs are our priority.  As part of our continuing mission to provide you with exceptional heart care, we have created designated Provider Care Teams.  These Care Teams include your primary Cardiologist (physician) and Advanced Practice Providers (APPs -  Physician Assistants and Nurse Practitioners)  who all work together to provide you with the care you need, when you need it.  We recommend signing up for the patient portal called "MyChart".  Sign up information is provided on this After Visit Summary.  MyChart is used to connect with patients for Virtual Visits (Telemedicine).  Patients are able to view lab/test results, encounter notes, upcoming appointments, etc.  Non-urgent messages can be sent to your provider as well.   To learn more about what you can do with MyChart, go to [email or web address].    Your next appointment:   1 month(s) after your ablation  The format for your next appointment:   In Person  Provider:   [facility]   Thank you for choosing [facility]!!   Daveah Varone, RN [phone]    Other Instructions   Cardiac Ablation Cardiac ablation is a procedure to destroy (ablate) some heart tissue that is sending bad signals. These bad signals cause problems in heart rhythm. The heart has many areas that make these signals. If there are problems in these areas, they can make the heart beat in a way that is not normal. Destroying some tissues can help make the heart rhythm normal. Tell your doctor about: Any allergies you have. All medicines you are taking. These include vitamins, herbs, eye drops, creams, and over-the-counter medicines. Any problems you or family members have had with medicines that make you fall asleep (anesthetics). Any blood disorders you have. Any surgeries you have had. Any medical conditions you have, such as kidney failure. Whether you are pregnant or may be pregnant. What are the risks?   This is a safe procedure. But problems may occur, including: Infection. Bruising and bleeding. Bleeding into the chest. Stroke or blood clots. Damage to nearby areas of your body. Allergies to medicines or dyes. The need for a pacemaker if the normal system is damaged. Failure of the procedure to treat the problem. What happens before  the procedure? Medicines Ask your doctor about: Changing or stopping your normal medicines. This is important. Taking aspirin and ibuprofen. Do not take these medicines unless your doctor tells you to take them. Taking other medicines, vitamins, herbs, and supplements. General instructions Follow instructions from your doctor about what you cannot eat or drink. Plan to have someone take you home from the hospital or clinic. If you will be going home right after the procedure, plan to have someone with you for 24 hours. Ask your doctor what steps will be taken to prevent infection. What happens during the procedure?  An IV tube will be put into one of your veins. You will be given a medicine to help you relax. The skin on your neck or groin will be numbed. A cut (incision) will be made in your neck or groin. A needle will be put through your cut and into a large vein. A tube (catheter) will be put into the needle. The tube will be moved to your heart. Dye may be put through the tube. This helps your doctor see your heart. Small devices (electrodes) on the tube will send out signals. A type of energy will be used to destroy some heart tissue. The tube will be taken out. Pressure will be held on your cut. This helps stop bleeding. A bandage will be put over your cut. The exact procedure may vary among doctors and hospitals. What happens after the procedure? You will be watched until you leave the hospital or clinic. This includes checking your heart rate, breathing rate, oxygen, and blood pressure. Your cut will be watched for bleeding. You will need to lie still for a few hours. Do not drive for 24 hours or as long as your doctor tells you. Summary Cardiac ablation is a procedure to destroy some heart tissue. This is done to treat heart rhythm problems. Tell your doctor about any medical conditions you may have. Tell him or her about all medicines you are taking to treat them. This is a  safe procedure. But problems may occur. These include infection, bruising, bleeding, and damage to nearby areas of your body. Follow what your doctor tells you about food and drink. You may also be told to change or stop some of your medicines. After the procedure, do not drive for 24 hours or as long as your doctor tells you. This information is not intended to replace advice given to you by your health care provider. Make sure you discuss any questions you have with your health care provider. Document Revised: 07/25/2021 Document Reviewed: 04/06/2019 Elsevier Patient Education  2023 [employer] Inc[employer]   

## 2021-12-30 LAB — BASIC METABOLIC PANEL
BUN/Creatinine Ratio: 16 (ref 10–24)
BUN: 17 mg/dL (ref 8–27)
CO2: 21 mmol/L (ref 20–29)
Calcium: 9.6 mg/dL (ref 8.6–10.2)
Chloride: 98 mmol/L (ref 96–106)
Creatinine, Ser: 1.05 mg/dL (ref 0.76–1.27)
Glucose: 82 mg/dL (ref 70–99)
Potassium: 4.8 mmol/L (ref 3.5–5.2)
Sodium: 133 mmol/L — ABNORMAL LOW (ref 134–144)
eGFR: 81 mL/min/{1.73_m2} (ref >59)

## 2021-12-30 LAB — CBC
Hematocrit: 45.7 % (ref 37.5–51.0)
Hemoglobin: 15.6 g/dL (ref 13.0–17.7)
MCH: 32.4 pg (ref 26.6–33.0)
MCHC: 34.1 g/dL (ref 31.5–35.7)
MCV: 95 fL (ref 79–97)
Platelets: 277 10*3/uL (ref 150–450)
RBC: 4.81 x10E6/uL (ref 4.14–5.80)
RDW: 12.8 % (ref 11.6–15.4)
WBC: 9.7 10*3/uL (ref 3.4–10.8)

## 2022-01-02 ENCOUNTER — Ambulatory Visit (HOSPITAL_BASED_OUTPATIENT_CLINIC_OR_DEPARTMENT_OTHER): Payer: Managed Care, Other (non HMO) | Admitting: Anesthesiology

## 2022-01-02 ENCOUNTER — Ambulatory Visit (HOSPITAL_COMMUNITY): Payer: Managed Care, Other (non HMO) | Admitting: Anesthesiology

## 2022-01-02 ENCOUNTER — Ambulatory Visit (HOSPITAL_COMMUNITY)
Admission: RE | Admit: 2022-01-02 | Discharge: 2022-01-02 | Disposition: A | Discharge status: Home / Self Care | Payer: Managed Care, Other (non HMO) | Attending: Cardiology | Admitting: Cardiology

## 2022-01-02 ENCOUNTER — Encounter (HOSPITAL_COMMUNITY)
Admission: RE | Disposition: A | Discharge status: Home / Self Care | Payer: Self-pay | Source: Home / Self Care | Attending: Cardiology

## 2022-01-02 ENCOUNTER — Other Ambulatory Visit: Payer: Self-pay

## 2022-01-02 DIAGNOSIS — Z87891 Personal history of nicotine dependence: Secondary | ICD-10-CM

## 2022-01-02 DIAGNOSIS — I509 Heart failure, unspecified: Secondary | ICD-10-CM

## 2022-01-02 DIAGNOSIS — I110 Hypertensive heart disease with heart failure: Secondary | ICD-10-CM | POA: Insufficient documentation

## 2022-01-02 DIAGNOSIS — I428 Other cardiomyopathies: Secondary | ICD-10-CM | POA: Diagnosis not present

## 2022-01-02 DIAGNOSIS — I340 Nonrheumatic mitral (valve) insufficiency: Secondary | ICD-10-CM | POA: Insufficient documentation

## 2022-01-02 DIAGNOSIS — Z7901 Long term (current) use of anticoagulants: Secondary | ICD-10-CM | POA: Insufficient documentation

## 2022-01-02 DIAGNOSIS — Z6838 Body mass index (BMI) 38.0-38.9, adult: Secondary | ICD-10-CM | POA: Diagnosis not present

## 2022-01-02 DIAGNOSIS — I483 Typical atrial flutter: Secondary | ICD-10-CM | POA: Insufficient documentation

## 2022-01-02 DIAGNOSIS — I5022 Chronic systolic (congestive) heart failure: Secondary | ICD-10-CM | POA: Insufficient documentation

## 2022-01-02 DIAGNOSIS — I4892 Unspecified atrial flutter: Secondary | ICD-10-CM | POA: Diagnosis not present

## 2022-01-02 DIAGNOSIS — E669 Obesity, unspecified: Secondary | ICD-10-CM | POA: Diagnosis not present

## 2022-01-02 DIAGNOSIS — Z79899 Other long term (current) drug therapy: Secondary | ICD-10-CM | POA: Diagnosis not present

## 2022-01-02 HISTORY — PX: A-FLUTTER ABLATION: EP1230

## 2022-01-02 SURGERY — A-FLUTTER ABLATION
Anesthesia: General

## 2022-01-02 MED ORDER — LIDOCAINE 2% (20 MG/ML) 5 ML SYRINGE
INTRAMUSCULAR | Status: DC | PRN
  Administered 2022-01-02: 100 mg via INTRAVENOUS

## 2022-01-02 MED ORDER — SODIUM CHLORIDE 0.9% FLUSH: 3.0000 mL | INTRAVENOUS | Status: DC | PRN

## 2022-01-02 MED ORDER — DEXAMETHASONE SODIUM PHOSPHATE 10 MG/ML IJ SOLN
INTRAMUSCULAR | Status: DC | PRN
  Administered 2022-01-02: 5 mg via INTRAVENOUS

## 2022-01-02 MED ORDER — SODIUM CHLORIDE 0.9 % IV SOLN: INTRAVENOUS | Status: DC

## 2022-01-02 MED ORDER — ONDANSETRON HCL 4 MG/2ML IJ SOLN: 4.0000 mg | Freq: Four times a day (QID) | INTRAMUSCULAR | Status: DC | PRN

## 2022-01-02 MED ORDER — HEPARIN (PORCINE) IN NACL 1000-0.9 UT/500ML-% IV SOLN
INTRAVENOUS | Status: DC | PRN
  Administered 2022-01-02: 500 mL

## 2022-01-02 MED ORDER — ROCURONIUM BROMIDE 10 MG/ML (PF) SYRINGE: PREFILLED_SYRINGE | INTRAVENOUS | Status: DC | PRN

## 2022-01-02 MED ORDER — FENTANYL CITRATE (PF) 250 MCG/5ML IJ SOLN
INTRAMUSCULAR | Status: DC | PRN
Start: 2022-01-02 — End: 2022-01-02
  Administered 2022-01-02 (×2): 50 ug via INTRAVENOUS

## 2022-01-02 MED ORDER — ACETAMINOPHEN 325 MG PO TABS: 650.0000 mg | ORAL_TABLET | ORAL | Status: DC | PRN

## 2022-01-02 MED ORDER — PHENYLEPHRINE 80 MCG/ML (10ML) SYRINGE FOR IV PUSH (FOR BLOOD PRESSURE SUPPORT)
PREFILLED_SYRINGE | INTRAVENOUS | Status: DC | PRN
  Administered 2022-01-02 (×2): 80 ug via INTRAVENOUS

## 2022-01-02 MED ORDER — SUGAMMADEX SODIUM 200 MG/2ML IV SOLN
INTRAVENOUS | Status: DC | PRN
  Administered 2022-01-02: 250 mg via INTRAVENOUS

## 2022-01-02 MED ORDER — SODIUM CHLORIDE 0.9% FLUSH
3.0000 mL | Freq: Two times a day (BID) | INTRAVENOUS | Status: DC
Start: 2022-01-02 — End: 2022-01-02

## 2022-01-02 MED ORDER — PROPOFOL 10 MG/ML IV BOLUS
INTRAVENOUS | Status: DC | PRN
  Administered 2022-01-02: 100 mg via INTRAVENOUS

## 2022-01-02 MED ORDER — MIDAZOLAM HCL 2 MG/2ML IJ SOLN
INTRAMUSCULAR | Status: DC | PRN
  Administered 2022-01-02: 2 mg via INTRAVENOUS

## 2022-01-02 MED ORDER — SODIUM CHLORIDE 0.9 % IV SOLN: 250.0000 mL | INTRAVENOUS | Status: DC | PRN

## 2022-01-02 MED ORDER — ROCURONIUM BROMIDE 10 MG/ML (PF) SYRINGE
PREFILLED_SYRINGE | INTRAVENOUS | Status: DC | PRN
  Administered 2022-01-02: 100 mg via INTRAVENOUS
  Administered 2022-01-02: 30 mg via INTRAVENOUS

## 2022-01-02 MED ORDER — ONDANSETRON HCL 4 MG/2ML IJ SOLN
INTRAMUSCULAR | Status: DC | PRN
  Administered 2022-01-02: 4 mg via INTRAVENOUS

## 2022-01-02 SURGICAL SUPPLY — 12 items
CATH EZ STEER NAV 8MM F-J CUR (ABLATOR) IMPLANT
CATH WEB BI DIR CSDF CRV REPRO (CATHETERS) IMPLANT
CLOSURE PERCLOSE PROSTYLE (VASCULAR PRODUCTS) IMPLANT
MAT PREVALON FULL STRYKER (MISCELLANEOUS) IMPLANT
PACK EP LATEX FREE (CUSTOM PROCEDURE TRAY) ×1
PACK EP LF (CUSTOM PROCEDURE TRAY) ×1 IMPLANT
PAD DEFIB RADIO PHYSIO CONN (PAD) ×1 IMPLANT
PATCH CARTO3 (PAD) IMPLANT
SHEATH PINNACLE 7F 10CM (SHEATH) IMPLANT
SHEATH PINNACLE 8F 10CM (SHEATH) IMPLANT
SHEATH PROBE COVER 6X72 (BAG) IMPLANT
SHEATH SWARTZ RAMP 8.5F 60CM (SHEATH) IMPLANT

## 2022-01-02 NOTE — Anesthesia Preprocedure Evaluation (Addendum)
Anesthesia Evaluation  Patient identified by MRN, date of birth, ID band Patient awake    Reviewed: Allergy & Precautions, NPO status , Patient's Chart, lab work & pertinent test results  History of Anesthesia Complications Negative for: history of anesthetic complications  Airway Mallampati: II  TM Distance: >3 FB     Dental no notable dental hx. (+) Dental Advisory Given   Pulmonary neg pulmonary ROS, former smoker,    Pulmonary exam normal        Cardiovascular hypertension, Pt. on medications +CHF  + dysrhythmias Atrial Fibrillation  Rhythm:Regular Rate:Normal  Echo 10/21/2021 IMPRESSIONS   1. Left ventricular ejection fraction, by estimation, is 45 to 50%. The left ventricle has mildly decreased function. The left ventricle demonstrates global hypokinesis. There is mild concentric left ventricular hypertrophy. Left ventricular diastolic  parameters are consistent with Grade I diastolic dysfunction (impaired relaxation). 2. Right ventricular systolic function is normal. The right ventricular size is normal. Tricuspid regurgitation signal is inadequate for assessing PA pressure. 3. The mitral valve is normal in structure. Trivial mitral valve regurgitation. No evidence of mitral stenosis. 4. The aortic valve is tricuspid. Aortic valve regurgitation is trivial. No aortic stenosis is present. 5. Aortic dilatation noted. There is mild dilatation of the aortic root, measuring 38 mm. 6. The inferior vena cava is normal in size with greater than 50% respiratory variability, suggesting right atrial pressure of 3 mmHg.   Neuro/Psych negative neurological ROS     GI/Hepatic negative GI ROS, (+)     substance abuse  alcohol use,   Endo/Other  negative endocrine ROS  Renal/GU Renal disease     Musculoskeletal negative musculoskeletal ROS (+)   Abdominal   Peds  Hematology negative hematology ROS (+)   Anesthesia  Other Findings   Reproductive/Obstetrics                            Anesthesia Physical Anesthesia Plan  ASA: 3  Anesthesia Plan: General   Post-op Pain Management: Minimal or no pain anticipated   Induction: Intravenous  PONV Risk Score and Plan: 2 and Ondansetron and Dexamethasone  Airway Management Planned: Oral ETT  Additional Equipment:   Intra-op Plan:   Post-operative Plan: Extubation in OR  Informed Consent: I have reviewed the patients History and Physical, chart, labs and discussed the procedure including the risks, benefits and alternatives for the proposed anesthesia with the patient or authorized representative who has indicated his/her understanding and acceptance.     Dental advisory given  Plan Discussed with: Anesthesiologist and CRNA  Anesthesia Plan Comments:        Anesthesia Quick Evaluation

## 2022-01-02 NOTE — Transfer of Care (Signed)
Immediate Anesthesia Transfer of Care Note  Patient: Christopher Romero  Procedure(s) Performed: A-FLUTTER ABLATION  Patient Location: Cath Lab  Anesthesia Type:General  Level of Consciousness: awake, alert  and oriented  Airway & Oxygen Therapy: Patient Spontanous Breathing  Post-op Assessment: Report given to RN, Post -op Vital signs reviewed and stable and Patient moving all extremities  Post vital signs: Reviewed and stable  Last Vitals:  Vitals Value Taken Time  BP 162/81 01/02/22 1350  Temp    Pulse 66 01/02/22 1355  Resp 12 01/02/22 1355  SpO2 94 % 01/02/22 1355  Vitals shown include unvalidated device data.  Last Pain:  Vitals:   01/02/22 0953  TempSrc: Temporal         Complications: There were no known notable events for this encounter.

## 2022-01-02 NOTE — Interval H&P Note (Signed)
History and Physical Interval Note:  01/02/2022 11:24 AM  Christopher Romero  has presented today for surgery, with the diagnosis of atrial flutter.  The various methods of treatment have been discussed with the patient and family. After consideration of risks, benefits and other options for treatment, the patient has consented to  Procedure(s): A-FLUTTER ABLATION (N/A) as a surgical intervention.  The patient's history has been reviewed, patient examined, no change in status, stable for surgery.  I have reviewed the patient's chart and labs.  Questions were answered to the patient's satisfaction.     Juwan Vences [employer]

## 2022-01-02 NOTE — Discharge Instructions (Addendum)
Post procedure care instructions No driving for 4 days. No lifting over 5 lbs for 1 week. No vigorous or sexual activity for 1 week. You may return to work/your usual activities on 01/10/22. Keep procedure site clean & dry. If you notice increased pain, swelling, bleeding or pus, call/return!  You may shower after 24 hours, but no soaking in baths/hot tubs/pools for 1 week.    Cardiac Ablation, Care After  This sheet gives you information about how to care for yourself after your procedure. Your health care provider may also give you more specific instructions. If you have problems or questions, contact your health care provider. What can I expect after the procedure? After the procedure, it is common to have: Bruising around your puncture site. Tenderness around your puncture site. Skipped heartbeats. Tiredness (fatigue).  Follow these instructions at home: Puncture site care  Follow instructions from your health care provider about how to take care of your puncture site. Make sure you: If present, leave stitches (sutures), skin glue, or adhesive strips in place. These skin closures may need to stay in place for up to 2 weeks. If adhesive strip edges start to loosen and curl up, you may trim the loose edges. Do not remove adhesive strips completely unless your health care provider tells you to do that. If a large square bandage is present, this may be removed 24 hours after surgery.  Check your puncture site every day for signs of infection. Check for: Redness, swelling, or pain. Fluid or blood. If your puncture site starts to bleed, lie down on your back, apply firm pressure to the area, and contact your health care provider. Warmth. Pus or a bad smell. A pea or small marble sized lump at the site is normal and can take up to three months to resolve.  Driving Do not drive for at least 4 days after your procedure or however long your health care provider recommends. (Do not resume driving  if you have previously been instructed not to drive for other health reasons.) Do not drive or use heavy machinery while taking prescription pain medicine. Activity Avoid activities that take a lot of effort for at least 7 days after your procedure. Do not lift anything that is heavier than 5 lb (4.5 kg) for one week.  No sexual activity for 1 week.  Return to your normal activities as told by your health care provider. Ask your health care provider what activities are safe for you. General instructions Take over-the-counter and prescription medicines only as told by your health care provider. Do not use any products that contain nicotine or tobacco, such as cigarettes and e-cigarettes. If you need help quitting, ask your health care provider. You may shower after 24 hours, but Do not take baths, swim, or use a hot tub for 1 week.  Do not drink alcohol for 24 hours after your procedure. Keep all follow-up visits as told by your health care provider. This is important. Contact a health care provider if: You have redness, mild swelling, or pain around your puncture site. You have fluid or blood coming from your puncture site that stops after applying firm pressure to the area. Your puncture site feels warm to the touch. You have pus or a bad smell coming from your puncture site. You have a fever. You have chest pain or discomfort that spreads to your neck, jaw, or arm. You are sweating a lot. You feel nauseous. You have a fast or irregular heartbeat.  You have shortness of breath. You are dizzy or light-headed and feel the need to lie down. You have pain or numbness in the arm or leg closest to your puncture site. Get help right away if: Your puncture site suddenly swells. Your puncture site is bleeding and the bleeding does not stop after applying firm pressure to the area. These symptoms may represent a serious problem that is an emergency. Do not wait to see if the symptoms will go away.  Get medical help right away. Call your local emergency services (911 in the [place] Do not drive yourself to the hospital. Summary After the procedure, it is normal to have bruising and tenderness at the puncture site in your groin, neck, or forearm. Check your puncture site every day for signs of infection. Get help right away if your puncture site is bleeding and the bleeding does not stop after applying firm pressure to the area. This is a medical emergency. This information is not intended to replace advice given to you by your health care provider. Make sure you discuss any questions you have with your health care provider.

## 2022-01-02 NOTE — Anesthesia Procedure Notes (Signed)
Procedure Name: Intubation Date/Time: 01/02/2022 12:21 PM  Performed by: Amadeo Garnet, CRNAPre-anesthesia Checklist: Patient identified, Emergency Drugs available, Suction available and Patient being monitored Patient Re-evaluated:Patient Re-evaluated prior to induction Oxygen Delivery Method: Circle system utilized Preoxygenation: Pre-oxygenation with 100% oxygen Induction Type: IV induction Ventilation: Mask ventilation without difficulty, Oral airway inserted - appropriate to patient size and Two handed mask ventilation required Laryngoscope Size: Mac and 4 Grade View: Grade I Tube type: Oral Tube size: 7.5 mm Number of attempts: 1 Airway Equipment and Method: Stylet and Oral airway Placement Confirmation: ETT inserted through vocal cords under direct vision, positive ETCO2 and breath sounds checked- equal and bilateral Secured at: 22 cm Tube secured with: Tape Dental Injury: Teeth and Oropharynx as per pre-operative assessment

## 2022-01-04 NOTE — Anesthesia Postprocedure Evaluation (Signed)
Anesthesia Post Note  Patient: Christopher Romero  Procedure(s) Performed: A-FLUTTER ABLATION     Patient location during evaluation: PACU Anesthesia Type: General Level of consciousness: sedated Pain management: pain level controlled Vital Signs Assessment: post-procedure vital signs reviewed and stable Respiratory status: spontaneous breathing and respiratory function stable Cardiovascular status: stable Postop Assessment: no apparent nausea or vomiting Anesthetic complications: no   There were no known notable events for this encounter.  Last Vitals:  Vitals:   01/02/22 1630 01/02/22 1700  BP: (!) 141/72 133/77  Pulse: 88 82  Resp: 18 15  Temp:    SpO2: 93% 95%    Last Pain:  Vitals:   01/02/22 1445  TempSrc:   PainSc: 0-No pain                 Brandilynn Taormina DANIEL

## 2022-01-05 ENCOUNTER — Encounter (HOSPITAL_COMMUNITY): Payer: Self-pay | Admitting: Cardiology

## 2022-01-22 ENCOUNTER — Encounter (HOSPITAL_COMMUNITY): Payer: Managed Care, Other (non HMO)

## 2022-01-26 ENCOUNTER — Ambulatory Visit: Payer: Managed Care, Other (non HMO) | Attending: Cardiology | Admitting: Cardiology

## 2022-01-26 ENCOUNTER — Encounter: Payer: Self-pay | Admitting: Cardiology

## 2022-01-26 VITALS — BP 132/82 | HR 79 | Ht 70.0 in | Wt 293.6 lb

## 2022-01-26 DIAGNOSIS — I483 Typical atrial flutter: Secondary | ICD-10-CM

## 2022-01-26 NOTE — Progress Notes (Signed)
Electrophysiology Office Note   Date:  01/26/2022   ID:  Christopher Romero, DOB 1961-05-04, MRN [id number]  PCP:  Jerrye Bushy, FNP  Cardiologist:  Shirlee Latch Primary Electrophysiologist:  Maclain Cohron Jorja Loa, MD    Chief Complaint: atrial flutter   History of Present Illness: Christopher Romero is a 61 y.o. male who is being seen today for the evaluation of atrial flutter at the request of Jerrye Bushy, FNP. Presenting today for electrophysiology evaluation.  He has a history significant hypertension, hyperlipidemia, obesity.  He was admitted to the hospital March 2023 with atrial flutter and rapid rates as well as new onset heart failure.  Ejection fraction was 20-25%.  He was started on IV amiodarone.  Left heart catheterization showed elevated filling pressures and nonobstructive coronary artery disease.  He converted to sinus rhythm with amiodarone.  He is now status post atrial flutter ablation 01/02/2022.  Today, denies symptoms of palpitations, chest pain, shortness of breath, orthopnea, PND, lower extremity edema, claudication, dizziness, presyncope, syncope, bleeding, or neurologic sequela. The patient is tolerating medications without difficulties.  Since being seen he has done well.  He has had no chest pain or shortness of breath.  He is able to do all of his daily activities.  He has had no further episodes of atrial flutter since his ablation.     Past Medical History:  Diagnosis Date   Dyslipidemia    Hypertension    Visual impairment    L is severely impaired, R is about 20/30   Past Surgical History:  Procedure Laterality Date   A-FLUTTER ABLATION N/A 01/02/2022   Procedure: A-FLUTTER ABLATION;  Surgeon: Regan Lemming, MD;  Location: [facility] INVASIVE CV LAB;  Service: Cardiovascular;  Laterality: N/A;   ANEURYSM COILING  2008   visual impairment, sees little out of the left   KNEE ARTHROSCOPY     LEFT HEART CATH AND CORONARY ANGIOGRAPHY N/A 07/21/2021   Procedure: LEFT  HEART CATH AND CORONARY ANGIOGRAPHY;  Surgeon: Laurey Morale, MD;  Location: [facility] INVASIVE CV LAB;  Service: Cardiovascular;  Laterality: N/A;   RIGHT HEART CATH N/A 07/18/2021   Procedure: RIGHT HEART CATH;  Surgeon: Laurey Morale, MD;  Location: [facility] INVASIVE CV LAB;  Service: Cardiovascular;  Laterality: N/A;     Current Outpatient Medications  Medication Sig Dispense Refill   acetaminophen (TYLENOL) 500 MG tablet Take 1,000 mg by mouth every 6 (six) hours as needed for mild pain.     amiodarone (PACERONE) 200 MG tablet Take 1 tablet (200 mg total) by mouth daily. 30 tablet 3   apixaban (ELIQUIS) 5 MG TABS tablet Take 1 tablet (5 mg total) by mouth 2 (two) times daily. 60 tablet 3   dapagliflozin propanediol (FARXIGA) 10 MG TABS tablet Take 1 tablet (10 mg total) by mouth daily. 90 tablet 3   diphenhydramine-acetaminophen (TYLENOL PM) 25-500 MG TABS tablet Take 1 tablet by mouth at bedtime as needed (sleep).     furosemide (LASIX) 20 MG tablet Take 1 tablet (20 mg total) by mouth as needed for fluid or edema. 1 tablet daily as needed for weight gain of 3 lbs in 24 hour or 5 lbs in a week 30 tablet 3   sacubitril-valsartan (ENTRESTO) 97-103 MG Take 1 tablet by mouth 2 (two) times daily. 180 tablet 3   spironolactone (ALDACTONE) 25 MG tablet Take 1 tablet (25 mg total) by mouth daily. 30 tablet 3   No current facility-administered medications for this visit.  Allergies:   Patient has no known allergies.   Social History:  The patient  reports that he has quit smoking. His smoking use included cigarettes. He has a 60.00 pack-year smoking history. He has never used smokeless tobacco. He reports current alcohol use. He reports that he does not use drugs.   Family History:  The patient's family history includes CAD in his sister and another family member; CAD (age of onset: 21) in his father; Cancer in his father; Dementia in his mother; Heart failure in an other family member; Hypertension in  his brother and sister; Parkinsonism in his mother.   ROS:  Please see the history of present illness.   Otherwise, review of systems is positive for none.   All other systems are reviewed and negative.   PHYSICAL EXAM: VS:  BP 132/82   Pulse 79   Ht 5\' 10"  (1.778 m)   Wt 293 lb 9.6 oz (133.2 kg)   SpO2 94%   BMI 42.13 kg/m  , BMI Body mass index is 42.13 kg/m. GEN: Well nourished, well developed, in no acute distress  HEENT: normal  Neck: no JVD, carotid bruits, or masses Cardiac: RRR; no murmurs, rubs, or gallops,no edema  Respiratory:  clear to auscultation bilaterally, normal work of breathing GI: soft, nontender, nondistended, + BS MS: no deformity or atrophy  Skin: warm and dry Neuro:  Strength and sensation are intact Psych: euthymic mood, full affect  EKG:  EKG is ordered today. Personal review of the ekg ordered shows sinus rhythm, rate 79  Recent Labs: 07/18/2021: Magnesium 2.2 10/21/2021: ALT 23; TSH 1.541 12/29/2021: BUN 17; Creatinine, Ser 1.05; Hemoglobin 15.6; Platelets 277; Potassium 4.8; Sodium 133    Lipid Panel     Component Value Date/Time   CHOL 152 07/17/2021 1815   TRIG 96 07/17/2021 1815   HDL 18 (L) 07/17/2021 1815   CHOLHDL 8.4 07/17/2021 1815   VLDL 19 07/17/2021 1815   LDLCALC 115 (H) 07/17/2021 1815     Wt Readings from Last 3 Encounters:  01/26/22 293 lb 9.6 oz (133.2 kg)  01/02/22 270 lb (122.5 kg)  12/29/21 279 lb (126.6 kg)      Other studies Reviewed: Additional studies/ records that were reviewed today include: TTE 10/21/21  Review of the above records today demonstrates:   1. Left ventricular ejection fraction, by estimation, is 45 to 50%. The  left ventricle has mildly decreased function. The left ventricle  demonstrates global hypokinesis. There is mild concentric left ventricular  hypertrophy. Left ventricular diastolic  parameters are consistent with Grade I diastolic dysfunction (impaired  relaxation).   2. Right  ventricular systolic function is normal. The right ventricular  size is normal. Tricuspid regurgitation signal is inadequate for assessing  PA pressure.   3. The mitral valve is normal in structure. Trivial mitral valve  regurgitation. No evidence of mitral stenosis.   4. The aortic valve is tricuspid. Aortic valve regurgitation is trivial.  No aortic stenosis is present.   5. Aortic dilatation noted. There is mild dilatation of the aortic root,  measuring 38 mm.   6. The inferior vena cava is normal in size with greater than 50%  respiratory variability, suggesting right atrial pressure of 3 mmHg.   LHC 07/21/21 1. Mild nonobstructive CAD.  2. LVEDP 11  ASSESSMENT AND PLAN:  1.  Chronic systolic heart failure due to nonischemic cardiomyopathy: Currently on optimal medical therapy with carvedilol, Entresto, Aldactone, Farxiga.  Repeat echo shows improvement in  his ejection fraction.  We Haidar Muse continue with management per primary cardiology.  2.  Typical atrial flutter: Currently on Eliquis 5 mg twice daily.  CHA2DS2-VASc of 1.  Status post ablation for atrial flutter 01/02/2022.  He is back in normal rhythm.  Due to his low stroke risk and ablation for atrial flutter, we Shye Doty stop both amiodarone and Eliquis today.  3.  Moderate severe mitral regurgitation: Likely functional.  Has improved with improvement in his ejection fraction.  Plan per primary cardiology.   Current medicines are reviewed at length with the patient today.   The patient does not have concerns regarding his medicines.  The following changes were made today: Stop amiodarone, Eliquis  Labs/ tests ordered today include:  Orders Placed This Encounter  Procedures   EKG 12-Lead     Disposition:   FU with Dash Cardarelli as needed months  Signed, Jequan Shahin Meredith Leeds, MD  01/26/2022 1:40 PM     [place] [address] [phone] (office) [phone] (fax)

## 2022-01-26 NOTE — Patient Instructions (Signed)
Medication Instructions:  Your physician has recommended you make the following change in your medication:  STOP Amiodarone STOP Eliquis  *If you need a refill on your cardiac medications before your next appointment, please call your pharmacy*   Lab Work: None ordered If you have labs (blood work) drawn today and your tests are completely normal, you will receive your results only by: MyChart Message (if you have MyChart) OR A paper copy in the mail If you have any lab test that is abnormal or we need to change your treatment, we will call you to review the results.   Testing/Procedures: None ordered   Follow-Up: At [facility], you and your health needs are our priority.  As part of our continuing mission to provide you with exceptional heart care, we have created designated Provider Care Teams.  These Care Teams include your primary Cardiologist (physician) and Advanced Practice Providers (APPs -  Physician Assistants and Nurse Practitioners) who all work together to provide you with the care you need, when you need it.  Your next appointment:   as  needed  The format for your next appointment:   In Person  Provider:   Loman Brooklyn, MD    Thank you for choosing [facility]!!   Dory Horn, RN [phone]  Other Instructions    Important Information About Sugar

## 2022-05-04 ENCOUNTER — Other Ambulatory Visit (HOSPITAL_COMMUNITY): Payer: Self-pay | Admitting: *Deleted

## 2022-05-04 ENCOUNTER — Telehealth (HOSPITAL_COMMUNITY): Payer: Self-pay | Admitting: Licensed Clinical Social Worker

## 2022-05-04 MED ORDER — SPIRONOLACTONE 25 MG PO TABS: 25.0000 mg | ORAL_TABLET | Freq: Every day | ORAL | 3 refills | Status: AC

## 2022-05-04 NOTE — Telephone Encounter (Signed)
H&V Care Navigation CSW Progress Note  Clinical Social Worker received call from pt sister who informed CSW that pt has been out of medications for past week or so after losing his job and his insurance coverage about 12 weeks ago.  Pt sister inquiring about potential assistance to get medications without insurance and expressed concerns with pt health off medications (has a "heart failure cough" and extended belly).    CSW called pt to discuss above concerns but phone went straight to VM- left message requesting return call.  Will continue to attempt contact  SDOH Screenings   Tobacco Use: Medium Risk (01/26/2022)   Burna Sis, LCSW Clinical Social Worker [facility] Desk#: [phone] Cell#: [phone]

## 2022-05-04 NOTE — Telephone Encounter (Signed)
H&V Care Navigation CSW Progress Note  Clinical Social Worker received call back from pt who endorsed concerns expressed by sister other than health concerns- states he feels great and no concerns about weight gain or increased shortness of breath since stopping medications.  CSW discussed options and pt agreeabled to completing PAP to get entresto and jardiance (currently on farxiga but will have to change).  Also sent CAFA to help with outstanding bills and help pt feel comfortable coming to medical appts- currently not interested because fear of outstanding bills.    CSW placed 1 month samples of Entresto and Farxiga at front desk for pt and mailing applications out for assistance- he will plan to bring back apps Friday and get samples.  CSW had staff send spiro to local [employer] so he can get through $4 list.   SDOH Screenings   Tobacco Use: Medium Risk (01/26/2022)   Burna Sis, LCSW Clinical Social Worker [facility] Desk#: [phone] Cell#: [phone]

## 2023-05-12 IMAGING — DX DG CHEST 1V PORT
1 series · 1 of 1 positions shown · non-contrast
Comparison: None.

CLINICAL DATA: 60-year-old male with tachycardia, palpitations,
some shortness of breath.

EXAM:
PORTABLE CHEST 1 VIEW

[chest]
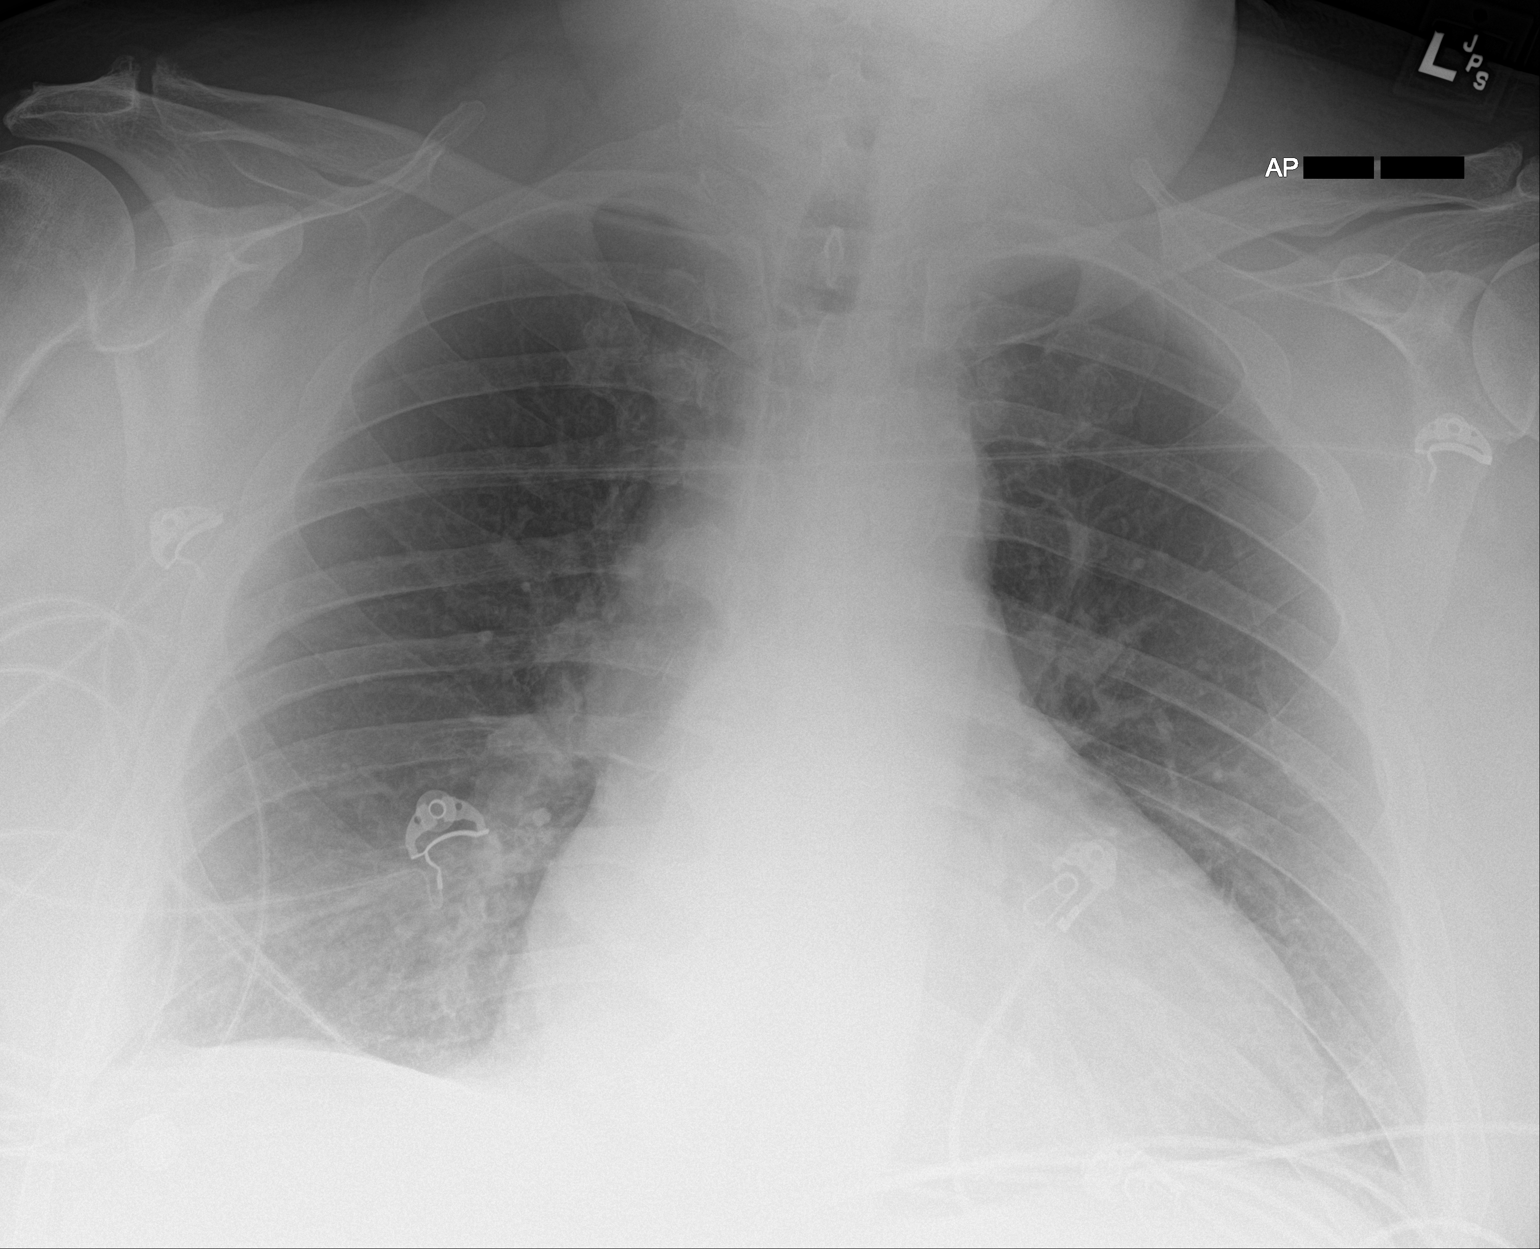

[1 of 1 positions shown; findings below may reference images not displayed]

FINDINGS: Portable AP upright view at 9711 hours. Mild cardiomegaly. Other
mediastinal contours are within normal limits. Visualized tracheal
air column is within normal limits. No definite pleural effusion.
Allowing for portable technique the lungs are clear. No
pneumothorax. No acute osseous abnormality identified.
IMPRESSION: Borderline to mild cardiomegaly. No acute cardiopulmonary
abnormality.

## 2023-10-01 ENCOUNTER — Other Ambulatory Visit (HOSPITAL_BASED_OUTPATIENT_CLINIC_OR_DEPARTMENT_OTHER): Payer: Self-pay
